# Patient Record
Sex: Female | Born: 1970 | Race: Black or African American | Hispanic: No | Marital: Single | Smoking: Current every day smoker
Health system: Southern US, Community
[De-identification: ages and names within clinical notes are randomized; demographics above are authoritative.]

## PROBLEM LIST (undated history)

## (undated) DIAGNOSIS — E785 Hyperlipidemia, unspecified: Secondary | ICD-10-CM

## (undated) DIAGNOSIS — F329 Major depressive disorder, single episode, unspecified: Secondary | ICD-10-CM

## (undated) DIAGNOSIS — F32A Depression, unspecified: Secondary | ICD-10-CM

## (undated) DIAGNOSIS — M539 Dorsopathy, unspecified: Secondary | ICD-10-CM

## (undated) DIAGNOSIS — I10 Essential (primary) hypertension: Secondary | ICD-10-CM

## (undated) DIAGNOSIS — K219 Gastro-esophageal reflux disease without esophagitis: Secondary | ICD-10-CM

## (undated) HISTORY — DX: Gastro-esophageal reflux disease without esophagitis: K21.9

## (undated) HISTORY — PX: ABDOMINAL HYSTERECTOMY: SHX81

## (undated) HISTORY — PX: OOPHORECTOMY: SHX86

## (undated) HISTORY — PX: JOINT REPLACEMENT: SHX530

## (undated) HISTORY — DX: Major depressive disorder, single episode, unspecified: F32.9

## (undated) HISTORY — PX: ROTATOR CUFF REPAIR: SHX139

## (undated) HISTORY — DX: Essential (primary) hypertension: I10

## (undated) HISTORY — PX: OTHER SURGICAL HISTORY: SHX169

## (undated) HISTORY — PX: CHOLECYSTECTOMY: SHX55

## (undated) HISTORY — DX: Hyperlipidemia, unspecified: E78.5

## (undated) HISTORY — DX: Depression, unspecified: F32.A

## (undated) HISTORY — PX: ELBOW SURGERY: SHX618

## (undated) HISTORY — DX: Dorsopathy, unspecified: M53.9

---

## 2014-11-10 DIAGNOSIS — M85421 Solitary bone cyst, right humerus: Secondary | ICD-10-CM | POA: Diagnosis not present

## 2014-11-10 DIAGNOSIS — M25551 Pain in right hip: Secondary | ICD-10-CM | POA: Diagnosis not present

## 2014-12-10 DIAGNOSIS — Z471 Aftercare following joint replacement surgery: Secondary | ICD-10-CM | POA: Diagnosis not present

## 2014-12-10 DIAGNOSIS — M85651 Other cyst of bone, right thigh: Secondary | ICD-10-CM | POA: Diagnosis not present

## 2014-12-10 DIAGNOSIS — Z96641 Presence of right artificial hip joint: Secondary | ICD-10-CM | POA: Diagnosis not present

## 2014-12-23 DIAGNOSIS — M85651 Other cyst of bone, right thigh: Secondary | ICD-10-CM | POA: Diagnosis not present

## 2014-12-29 DIAGNOSIS — J449 Chronic obstructive pulmonary disease, unspecified: Secondary | ICD-10-CM | POA: Diagnosis not present

## 2014-12-29 DIAGNOSIS — K219 Gastro-esophageal reflux disease without esophagitis: Secondary | ICD-10-CM | POA: Diagnosis not present

## 2014-12-29 DIAGNOSIS — E782 Mixed hyperlipidemia: Secondary | ICD-10-CM | POA: Diagnosis not present

## 2015-01-20 DIAGNOSIS — S83411A Sprain of medial collateral ligament of right knee, initial encounter: Secondary | ICD-10-CM | POA: Diagnosis not present

## 2015-01-27 DIAGNOSIS — E785 Hyperlipidemia, unspecified: Secondary | ICD-10-CM | POA: Diagnosis not present

## 2015-01-27 DIAGNOSIS — Z79899 Other long term (current) drug therapy: Secondary | ICD-10-CM | POA: Diagnosis not present

## 2015-01-27 DIAGNOSIS — E559 Vitamin D deficiency, unspecified: Secondary | ICD-10-CM | POA: Diagnosis not present

## 2015-02-01 DIAGNOSIS — I10 Essential (primary) hypertension: Secondary | ICD-10-CM | POA: Diagnosis not present

## 2015-02-01 DIAGNOSIS — E782 Mixed hyperlipidemia: Secondary | ICD-10-CM | POA: Diagnosis not present

## 2015-02-01 DIAGNOSIS — E559 Vitamin D deficiency, unspecified: Secondary | ICD-10-CM | POA: Diagnosis not present

## 2015-02-05 DIAGNOSIS — S83411D Sprain of medial collateral ligament of right knee, subsequent encounter: Secondary | ICD-10-CM | POA: Diagnosis not present

## 2015-02-15 DIAGNOSIS — G894 Chronic pain syndrome: Secondary | ICD-10-CM | POA: Diagnosis not present

## 2015-02-15 DIAGNOSIS — M545 Low back pain: Secondary | ICD-10-CM | POA: Diagnosis not present

## 2015-02-15 DIAGNOSIS — M25561 Pain in right knee: Secondary | ICD-10-CM | POA: Diagnosis not present

## 2015-02-15 DIAGNOSIS — Z79891 Long term (current) use of opiate analgesic: Secondary | ICD-10-CM | POA: Diagnosis not present

## 2015-02-15 DIAGNOSIS — S83411S Sprain of medial collateral ligament of right knee, sequela: Secondary | ICD-10-CM | POA: Diagnosis not present

## 2015-02-26 DIAGNOSIS — S83411D Sprain of medial collateral ligament of right knee, subsequent encounter: Secondary | ICD-10-CM | POA: Diagnosis not present

## 2015-03-02 DIAGNOSIS — E119 Type 2 diabetes mellitus without complications: Secondary | ICD-10-CM | POA: Diagnosis not present

## 2015-03-02 DIAGNOSIS — R5383 Other fatigue: Secondary | ICD-10-CM | POA: Diagnosis not present

## 2015-03-02 DIAGNOSIS — F17200 Nicotine dependence, unspecified, uncomplicated: Secondary | ICD-10-CM | POA: Diagnosis not present

## 2015-03-02 DIAGNOSIS — R945 Abnormal results of liver function studies: Secondary | ICD-10-CM | POA: Diagnosis not present

## 2015-03-02 DIAGNOSIS — K219 Gastro-esophageal reflux disease without esophagitis: Secondary | ICD-10-CM | POA: Diagnosis not present

## 2015-03-02 DIAGNOSIS — Z6831 Body mass index (BMI) 31.0-31.9, adult: Secondary | ICD-10-CM | POA: Diagnosis not present

## 2015-03-02 DIAGNOSIS — R5381 Other malaise: Secondary | ICD-10-CM | POA: Diagnosis not present

## 2015-03-02 DIAGNOSIS — J449 Chronic obstructive pulmonary disease, unspecified: Secondary | ICD-10-CM | POA: Diagnosis not present

## 2015-03-02 DIAGNOSIS — E785 Hyperlipidemia, unspecified: Secondary | ICD-10-CM | POA: Diagnosis not present

## 2015-03-02 DIAGNOSIS — G4700 Insomnia, unspecified: Secondary | ICD-10-CM | POA: Diagnosis not present

## 2015-03-15 DIAGNOSIS — M25561 Pain in right knee: Secondary | ICD-10-CM | POA: Diagnosis not present

## 2015-03-15 DIAGNOSIS — S83411S Sprain of medial collateral ligament of right knee, sequela: Secondary | ICD-10-CM | POA: Diagnosis not present

## 2015-03-15 DIAGNOSIS — G894 Chronic pain syndrome: Secondary | ICD-10-CM | POA: Diagnosis not present

## 2015-03-15 DIAGNOSIS — M545 Low back pain: Secondary | ICD-10-CM | POA: Diagnosis not present

## 2015-03-19 DIAGNOSIS — M85651 Other cyst of bone, right thigh: Secondary | ICD-10-CM | POA: Diagnosis not present

## 2015-03-19 DIAGNOSIS — S83411A Sprain of medial collateral ligament of right knee, initial encounter: Secondary | ICD-10-CM | POA: Diagnosis not present

## 2015-04-14 DIAGNOSIS — S83411S Sprain of medial collateral ligament of right knee, sequela: Secondary | ICD-10-CM | POA: Diagnosis not present

## 2015-04-14 DIAGNOSIS — M545 Low back pain: Secondary | ICD-10-CM | POA: Diagnosis not present

## 2015-04-14 DIAGNOSIS — G894 Chronic pain syndrome: Secondary | ICD-10-CM | POA: Diagnosis not present

## 2015-04-14 DIAGNOSIS — M25561 Pain in right knee: Secondary | ICD-10-CM | POA: Diagnosis not present

## 2015-04-28 DIAGNOSIS — M545 Low back pain: Secondary | ICD-10-CM | POA: Diagnosis not present

## 2015-05-06 DIAGNOSIS — E559 Vitamin D deficiency, unspecified: Secondary | ICD-10-CM | POA: Diagnosis not present

## 2015-05-21 DIAGNOSIS — S83411S Sprain of medial collateral ligament of right knee, sequela: Secondary | ICD-10-CM | POA: Diagnosis not present

## 2015-05-21 DIAGNOSIS — M545 Low back pain: Secondary | ICD-10-CM | POA: Diagnosis not present

## 2015-05-21 DIAGNOSIS — G894 Chronic pain syndrome: Secondary | ICD-10-CM | POA: Diagnosis not present

## 2015-05-21 DIAGNOSIS — Z79891 Long term (current) use of opiate analgesic: Secondary | ICD-10-CM | POA: Diagnosis not present

## 2015-05-21 DIAGNOSIS — M25561 Pain in right knee: Secondary | ICD-10-CM | POA: Diagnosis not present

## 2015-06-04 DIAGNOSIS — M5127 Other intervertebral disc displacement, lumbosacral region: Secondary | ICD-10-CM | POA: Diagnosis not present

## 2015-06-04 DIAGNOSIS — M25562 Pain in left knee: Secondary | ICD-10-CM | POA: Diagnosis not present

## 2015-06-04 DIAGNOSIS — M545 Low back pain: Secondary | ICD-10-CM | POA: Diagnosis not present

## 2015-06-15 DIAGNOSIS — M79605 Pain in left leg: Secondary | ICD-10-CM | POA: Diagnosis not present

## 2015-06-15 DIAGNOSIS — M5442 Lumbago with sciatica, left side: Secondary | ICD-10-CM | POA: Diagnosis not present

## 2015-06-15 DIAGNOSIS — G8929 Other chronic pain: Secondary | ICD-10-CM | POA: Diagnosis not present

## 2015-06-18 DIAGNOSIS — Z79891 Long term (current) use of opiate analgesic: Secondary | ICD-10-CM | POA: Diagnosis not present

## 2015-06-18 DIAGNOSIS — S83411S Sprain of medial collateral ligament of right knee, sequela: Secondary | ICD-10-CM | POA: Diagnosis not present

## 2015-06-18 DIAGNOSIS — G894 Chronic pain syndrome: Secondary | ICD-10-CM | POA: Diagnosis not present

## 2015-06-18 DIAGNOSIS — M545 Low back pain: Secondary | ICD-10-CM | POA: Diagnosis not present

## 2015-06-18 DIAGNOSIS — M25561 Pain in right knee: Secondary | ICD-10-CM | POA: Diagnosis not present

## 2015-06-19 DIAGNOSIS — R509 Fever, unspecified: Secondary | ICD-10-CM | POA: Diagnosis not present

## 2015-06-19 DIAGNOSIS — J329 Chronic sinusitis, unspecified: Secondary | ICD-10-CM | POA: Diagnosis not present

## 2015-06-19 DIAGNOSIS — J019 Acute sinusitis, unspecified: Secondary | ICD-10-CM | POA: Diagnosis not present

## 2015-07-01 DIAGNOSIS — E039 Hypothyroidism, unspecified: Secondary | ICD-10-CM | POA: Diagnosis not present

## 2015-07-01 DIAGNOSIS — E669 Obesity, unspecified: Secondary | ICD-10-CM | POA: Diagnosis not present

## 2015-07-01 DIAGNOSIS — Z1329 Encounter for screening for other suspected endocrine disorder: Secondary | ICD-10-CM | POA: Diagnosis not present

## 2015-07-01 DIAGNOSIS — Z0000 Encounter for general adult medical examination without abnormal findings: Secondary | ICD-10-CM | POA: Diagnosis not present

## 2015-07-01 DIAGNOSIS — Z79891 Long term (current) use of opiate analgesic: Secondary | ICD-10-CM | POA: Diagnosis not present

## 2015-07-01 DIAGNOSIS — E559 Vitamin D deficiency, unspecified: Secondary | ICD-10-CM | POA: Diagnosis not present

## 2015-07-13 DIAGNOSIS — Z0000 Encounter for general adult medical examination without abnormal findings: Secondary | ICD-10-CM | POA: Diagnosis not present

## 2015-07-13 DIAGNOSIS — N760 Acute vaginitis: Secondary | ICD-10-CM | POA: Diagnosis not present

## 2015-07-21 DIAGNOSIS — S83411S Sprain of medial collateral ligament of right knee, sequela: Secondary | ICD-10-CM | POA: Diagnosis not present

## 2015-07-21 DIAGNOSIS — M545 Low back pain: Secondary | ICD-10-CM | POA: Diagnosis not present

## 2015-07-21 DIAGNOSIS — G894 Chronic pain syndrome: Secondary | ICD-10-CM | POA: Diagnosis not present

## 2015-07-21 DIAGNOSIS — M25561 Pain in right knee: Secondary | ICD-10-CM | POA: Diagnosis not present

## 2015-08-18 DIAGNOSIS — Z23 Encounter for immunization: Secondary | ICD-10-CM | POA: Diagnosis not present

## 2015-08-18 DIAGNOSIS — M25561 Pain in right knee: Secondary | ICD-10-CM | POA: Diagnosis not present

## 2015-08-18 DIAGNOSIS — G894 Chronic pain syndrome: Secondary | ICD-10-CM | POA: Diagnosis not present

## 2015-08-18 DIAGNOSIS — S83411S Sprain of medial collateral ligament of right knee, sequela: Secondary | ICD-10-CM | POA: Diagnosis not present

## 2015-08-18 DIAGNOSIS — M545 Low back pain: Secondary | ICD-10-CM | POA: Diagnosis not present

## 2015-08-23 DIAGNOSIS — J019 Acute sinusitis, unspecified: Secondary | ICD-10-CM | POA: Diagnosis not present

## 2015-08-23 DIAGNOSIS — I10 Essential (primary) hypertension: Secondary | ICD-10-CM | POA: Diagnosis not present

## 2015-08-23 DIAGNOSIS — E782 Mixed hyperlipidemia: Secondary | ICD-10-CM | POA: Diagnosis not present

## 2015-08-23 DIAGNOSIS — E559 Vitamin D deficiency, unspecified: Secondary | ICD-10-CM | POA: Diagnosis not present

## 2015-09-03 DIAGNOSIS — Z1231 Encounter for screening mammogram for malignant neoplasm of breast: Secondary | ICD-10-CM | POA: Diagnosis not present

## 2015-09-03 DIAGNOSIS — R05 Cough: Secondary | ICD-10-CM | POA: Diagnosis not present

## 2015-09-03 DIAGNOSIS — F17200 Nicotine dependence, unspecified, uncomplicated: Secondary | ICD-10-CM | POA: Diagnosis not present

## 2015-10-13 DIAGNOSIS — M545 Low back pain: Secondary | ICD-10-CM | POA: Diagnosis not present

## 2015-10-13 DIAGNOSIS — M25561 Pain in right knee: Secondary | ICD-10-CM | POA: Diagnosis not present

## 2015-10-13 DIAGNOSIS — Z79891 Long term (current) use of opiate analgesic: Secondary | ICD-10-CM | POA: Diagnosis not present

## 2015-10-13 DIAGNOSIS — S83411S Sprain of medial collateral ligament of right knee, sequela: Secondary | ICD-10-CM | POA: Diagnosis not present

## 2015-10-13 DIAGNOSIS — G894 Chronic pain syndrome: Secondary | ICD-10-CM | POA: Diagnosis not present

## 2015-11-15 DIAGNOSIS — E782 Mixed hyperlipidemia: Secondary | ICD-10-CM | POA: Diagnosis not present

## 2015-11-15 DIAGNOSIS — J029 Acute pharyngitis, unspecified: Secondary | ICD-10-CM | POA: Diagnosis not present

## 2015-11-15 DIAGNOSIS — I10 Essential (primary) hypertension: Secondary | ICD-10-CM | POA: Diagnosis not present

## 2015-11-15 DIAGNOSIS — E559 Vitamin D deficiency, unspecified: Secondary | ICD-10-CM | POA: Diagnosis not present

## 2015-11-23 DIAGNOSIS — Z8249 Family history of ischemic heart disease and other diseases of the circulatory system: Secondary | ICD-10-CM | POA: Diagnosis not present

## 2015-11-23 DIAGNOSIS — F17200 Nicotine dependence, unspecified, uncomplicated: Secondary | ICD-10-CM | POA: Diagnosis not present

## 2015-11-23 DIAGNOSIS — R0789 Other chest pain: Secondary | ICD-10-CM | POA: Diagnosis not present

## 2015-11-23 DIAGNOSIS — R031 Nonspecific low blood-pressure reading: Secondary | ICD-10-CM | POA: Diagnosis not present

## 2015-11-23 DIAGNOSIS — Z79891 Long term (current) use of opiate analgesic: Secondary | ICD-10-CM | POA: Diagnosis not present

## 2015-11-23 DIAGNOSIS — E785 Hyperlipidemia, unspecified: Secondary | ICD-10-CM | POA: Diagnosis not present

## 2015-11-23 DIAGNOSIS — Z743 Need for continuous supervision: Secondary | ICD-10-CM | POA: Diagnosis not present

## 2015-11-23 DIAGNOSIS — R0602 Shortness of breath: Secondary | ICD-10-CM | POA: Diagnosis not present

## 2015-11-23 DIAGNOSIS — F419 Anxiety disorder, unspecified: Secondary | ICD-10-CM | POA: Diagnosis not present

## 2015-11-23 DIAGNOSIS — G8929 Other chronic pain: Secondary | ICD-10-CM | POA: Diagnosis not present

## 2015-11-23 DIAGNOSIS — R079 Chest pain, unspecified: Secondary | ICD-10-CM | POA: Diagnosis not present

## 2015-11-23 DIAGNOSIS — Z23 Encounter for immunization: Secondary | ICD-10-CM | POA: Diagnosis not present

## 2015-11-23 DIAGNOSIS — I499 Cardiac arrhythmia, unspecified: Secondary | ICD-10-CM | POA: Diagnosis not present

## 2015-11-24 DIAGNOSIS — E785 Hyperlipidemia, unspecified: Secondary | ICD-10-CM | POA: Diagnosis not present

## 2015-11-24 DIAGNOSIS — E7800 Pure hypercholesterolemia, unspecified: Secondary | ICD-10-CM | POA: Diagnosis not present

## 2015-11-24 DIAGNOSIS — G8929 Other chronic pain: Secondary | ICD-10-CM | POA: Diagnosis not present

## 2015-11-24 DIAGNOSIS — I499 Cardiac arrhythmia, unspecified: Secondary | ICD-10-CM | POA: Diagnosis not present

## 2015-11-24 DIAGNOSIS — R079 Chest pain, unspecified: Secondary | ICD-10-CM | POA: Diagnosis not present

## 2015-11-24 DIAGNOSIS — F17200 Nicotine dependence, unspecified, uncomplicated: Secondary | ICD-10-CM | POA: Diagnosis not present

## 2015-11-24 DIAGNOSIS — Z8249 Family history of ischemic heart disease and other diseases of the circulatory system: Secondary | ICD-10-CM | POA: Diagnosis not present

## 2015-11-25 DIAGNOSIS — E785 Hyperlipidemia, unspecified: Secondary | ICD-10-CM | POA: Diagnosis not present

## 2015-11-25 DIAGNOSIS — R079 Chest pain, unspecified: Secondary | ICD-10-CM | POA: Diagnosis not present

## 2015-11-25 DIAGNOSIS — F17200 Nicotine dependence, unspecified, uncomplicated: Secondary | ICD-10-CM | POA: Diagnosis not present

## 2015-12-08 DIAGNOSIS — S83411S Sprain of medial collateral ligament of right knee, sequela: Secondary | ICD-10-CM | POA: Diagnosis not present

## 2015-12-08 DIAGNOSIS — G894 Chronic pain syndrome: Secondary | ICD-10-CM | POA: Diagnosis not present

## 2015-12-08 DIAGNOSIS — M25561 Pain in right knee: Secondary | ICD-10-CM | POA: Diagnosis not present

## 2015-12-08 DIAGNOSIS — M545 Low back pain: Secondary | ICD-10-CM | POA: Diagnosis not present

## 2015-12-14 DIAGNOSIS — M25551 Pain in right hip: Secondary | ICD-10-CM | POA: Diagnosis not present

## 2015-12-14 DIAGNOSIS — M7651 Patellar tendinitis, right knee: Secondary | ICD-10-CM | POA: Diagnosis not present

## 2015-12-14 DIAGNOSIS — M25561 Pain in right knee: Secondary | ICD-10-CM | POA: Diagnosis not present

## 2015-12-27 DIAGNOSIS — M25562 Pain in left knee: Secondary | ICD-10-CM | POA: Diagnosis not present

## 2015-12-27 DIAGNOSIS — M7122 Synovial cyst of popliteal space [Baker], left knee: Secondary | ICD-10-CM | POA: Diagnosis not present

## 2015-12-27 DIAGNOSIS — M25462 Effusion, left knee: Secondary | ICD-10-CM | POA: Diagnosis not present

## 2015-12-29 DIAGNOSIS — M25561 Pain in right knee: Secondary | ICD-10-CM | POA: Diagnosis not present

## 2015-12-29 DIAGNOSIS — M25551 Pain in right hip: Secondary | ICD-10-CM | POA: Diagnosis not present

## 2015-12-29 DIAGNOSIS — M7651 Patellar tendinitis, right knee: Secondary | ICD-10-CM | POA: Diagnosis not present

## 2016-01-18 DIAGNOSIS — M7651 Patellar tendinitis, right knee: Secondary | ICD-10-CM | POA: Diagnosis not present

## 2016-01-27 DIAGNOSIS — I10 Essential (primary) hypertension: Secondary | ICD-10-CM | POA: Diagnosis not present

## 2016-01-27 DIAGNOSIS — Z8342 Family history of familial hypercholesterolemia: Secondary | ICD-10-CM | POA: Diagnosis not present

## 2016-01-27 DIAGNOSIS — R52 Pain, unspecified: Secondary | ICD-10-CM | POA: Diagnosis not present

## 2016-01-27 DIAGNOSIS — R5383 Other fatigue: Secondary | ICD-10-CM | POA: Diagnosis not present

## 2016-01-27 DIAGNOSIS — D519 Vitamin B12 deficiency anemia, unspecified: Secondary | ICD-10-CM | POA: Diagnosis not present

## 2016-01-27 DIAGNOSIS — E559 Vitamin D deficiency, unspecified: Secondary | ICD-10-CM | POA: Diagnosis not present

## 2016-01-27 DIAGNOSIS — G4700 Insomnia, unspecified: Secondary | ICD-10-CM | POA: Diagnosis not present

## 2016-01-27 DIAGNOSIS — E782 Mixed hyperlipidemia: Secondary | ICD-10-CM | POA: Diagnosis not present

## 2016-01-27 DIAGNOSIS — Z0000 Encounter for general adult medical examination without abnormal findings: Secondary | ICD-10-CM | POA: Diagnosis not present

## 2016-02-02 DIAGNOSIS — G894 Chronic pain syndrome: Secondary | ICD-10-CM | POA: Diagnosis not present

## 2016-02-02 DIAGNOSIS — S83411S Sprain of medial collateral ligament of right knee, sequela: Secondary | ICD-10-CM | POA: Diagnosis not present

## 2016-02-02 DIAGNOSIS — M25561 Pain in right knee: Secondary | ICD-10-CM | POA: Diagnosis not present

## 2016-02-02 DIAGNOSIS — Z79891 Long term (current) use of opiate analgesic: Secondary | ICD-10-CM | POA: Diagnosis not present

## 2016-02-02 DIAGNOSIS — M545 Low back pain: Secondary | ICD-10-CM | POA: Diagnosis not present

## 2016-02-25 DIAGNOSIS — Z885 Allergy status to narcotic agent status: Secondary | ICD-10-CM | POA: Diagnosis not present

## 2016-02-25 DIAGNOSIS — R1012 Left upper quadrant pain: Secondary | ICD-10-CM | POA: Diagnosis not present

## 2016-02-25 DIAGNOSIS — R10812 Left upper quadrant abdominal tenderness: Secondary | ICD-10-CM | POA: Diagnosis not present

## 2016-02-25 DIAGNOSIS — R030 Elevated blood-pressure reading, without diagnosis of hypertension: Secondary | ICD-10-CM | POA: Diagnosis not present

## 2016-02-25 DIAGNOSIS — F17210 Nicotine dependence, cigarettes, uncomplicated: Secondary | ICD-10-CM | POA: Diagnosis not present

## 2016-03-06 DIAGNOSIS — K5909 Other constipation: Secondary | ICD-10-CM | POA: Diagnosis not present

## 2016-03-06 DIAGNOSIS — I10 Essential (primary) hypertension: Secondary | ICD-10-CM | POA: Diagnosis not present

## 2016-03-06 DIAGNOSIS — E782 Mixed hyperlipidemia: Secondary | ICD-10-CM | POA: Diagnosis not present

## 2016-03-06 DIAGNOSIS — E559 Vitamin D deficiency, unspecified: Secondary | ICD-10-CM | POA: Diagnosis not present

## 2016-03-06 DIAGNOSIS — D518 Other vitamin B12 deficiency anemias: Secondary | ICD-10-CM | POA: Diagnosis not present

## 2016-03-07 DIAGNOSIS — G43119 Migraine with aura, intractable, without status migrainosus: Secondary | ICD-10-CM | POA: Diagnosis not present

## 2016-03-07 DIAGNOSIS — Z0001 Encounter for general adult medical examination with abnormal findings: Secondary | ICD-10-CM | POA: Diagnosis not present

## 2016-03-07 DIAGNOSIS — E568 Deficiency of other vitamins: Secondary | ICD-10-CM | POA: Diagnosis not present

## 2016-03-07 DIAGNOSIS — D518 Other vitamin B12 deficiency anemias: Secondary | ICD-10-CM | POA: Diagnosis not present

## 2016-03-07 DIAGNOSIS — D528 Other folate deficiency anemias: Secondary | ICD-10-CM | POA: Diagnosis not present

## 2016-03-07 DIAGNOSIS — E611 Iron deficiency: Secondary | ICD-10-CM | POA: Diagnosis not present

## 2016-03-07 DIAGNOSIS — G4709 Other insomnia: Secondary | ICD-10-CM | POA: Diagnosis not present

## 2016-03-27 DIAGNOSIS — Z885 Allergy status to narcotic agent status: Secondary | ICD-10-CM | POA: Diagnosis not present

## 2016-03-27 DIAGNOSIS — Z79891 Long term (current) use of opiate analgesic: Secondary | ICD-10-CM | POA: Diagnosis not present

## 2016-03-27 DIAGNOSIS — Z8249 Family history of ischemic heart disease and other diseases of the circulatory system: Secondary | ICD-10-CM | POA: Diagnosis not present

## 2016-03-27 DIAGNOSIS — M5432 Sciatica, left side: Secondary | ICD-10-CM | POA: Diagnosis not present

## 2016-03-27 DIAGNOSIS — F419 Anxiety disorder, unspecified: Secondary | ICD-10-CM | POA: Diagnosis not present

## 2016-03-27 DIAGNOSIS — I10 Essential (primary) hypertension: Secondary | ICD-10-CM | POA: Diagnosis not present

## 2016-03-27 DIAGNOSIS — M5442 Lumbago with sciatica, left side: Secondary | ICD-10-CM | POA: Diagnosis not present

## 2016-03-27 DIAGNOSIS — M549 Dorsalgia, unspecified: Secondary | ICD-10-CM | POA: Diagnosis not present

## 2016-03-27 DIAGNOSIS — Z886 Allergy status to analgesic agent status: Secondary | ICD-10-CM | POA: Diagnosis not present

## 2016-03-27 DIAGNOSIS — F17210 Nicotine dependence, cigarettes, uncomplicated: Secondary | ICD-10-CM | POA: Diagnosis not present

## 2016-03-31 ENCOUNTER — Encounter: Payer: Self-pay | Admitting: Family Medicine

## 2016-03-31 ENCOUNTER — Ambulatory Visit (INDEPENDENT_AMBULATORY_CARE_PROVIDER_SITE_OTHER): Payer: Medicare Other | Admitting: Family Medicine

## 2016-03-31 VITALS — BP 120/98 | HR 73 | Temp 98.3°F | Ht 66.0 in | Wt 201.0 lb

## 2016-03-31 DIAGNOSIS — Z130 Encounter for screening for diseases of the blood and blood-forming organs and certain disorders involving the immune mechanism: Secondary | ICD-10-CM

## 2016-03-31 DIAGNOSIS — Z0001 Encounter for general adult medical examination with abnormal findings: Secondary | ICD-10-CM

## 2016-03-31 DIAGNOSIS — Z79899 Other long term (current) drug therapy: Secondary | ICD-10-CM | POA: Diagnosis not present

## 2016-03-31 DIAGNOSIS — E785 Hyperlipidemia, unspecified: Secondary | ICD-10-CM | POA: Diagnosis not present

## 2016-03-31 DIAGNOSIS — G8929 Other chronic pain: Secondary | ICD-10-CM

## 2016-03-31 DIAGNOSIS — R030 Elevated blood-pressure reading, without diagnosis of hypertension: Secondary | ICD-10-CM | POA: Diagnosis not present

## 2016-03-31 DIAGNOSIS — I10 Essential (primary) hypertension: Secondary | ICD-10-CM | POA: Insufficient documentation

## 2016-03-31 DIAGNOSIS — G4700 Insomnia, unspecified: Secondary | ICD-10-CM | POA: Insufficient documentation

## 2016-03-31 DIAGNOSIS — E669 Obesity, unspecified: Secondary | ICD-10-CM | POA: Diagnosis not present

## 2016-03-31 DIAGNOSIS — IMO0001 Reserved for inherently not codable concepts without codable children: Secondary | ICD-10-CM

## 2016-03-31 DIAGNOSIS — M549 Dorsalgia, unspecified: Secondary | ICD-10-CM | POA: Diagnosis not present

## 2016-03-31 LAB — CBC
HCT: 43.9 % (ref 36.0–46.0)
HEMOGLOBIN: 14.4 g/dL (ref 12.0–15.0)
MCHC: 32.7 g/dL (ref 30.0–36.0)
MCV: 85.5 fl (ref 78.0–100.0)
PLATELETS: 384 10*3/uL (ref 150.0–400.0)
RBC: 5.13 Mil/uL — ABNORMAL HIGH (ref 3.87–5.11)
RDW: 13.7 % (ref 11.5–15.5)
WBC: 12.3 10*3/uL — ABNORMAL HIGH (ref 4.0–10.5)

## 2016-03-31 LAB — COMPREHENSIVE METABOLIC PANEL
ALT: 20 U/L (ref 0–35)
AST: 12 U/L (ref 0–37)
Albumin: 4.4 g/dL (ref 3.5–5.2)
Alkaline Phosphatase: 82 U/L (ref 39–117)
BILIRUBIN TOTAL: 0.2 mg/dL (ref 0.2–1.2)
BUN: 16 mg/dL (ref 6–23)
CALCIUM: 9.8 mg/dL (ref 8.4–10.5)
CHLORIDE: 105 meq/L (ref 96–112)
CO2: 29 meq/L (ref 19–32)
Creatinine, Ser: 0.84 mg/dL (ref 0.40–1.20)
GFR: 77.89 mL/min (ref >60.00)
GLUCOSE: 93 mg/dL (ref 70–99)
POTASSIUM: 3.9 meq/L (ref 3.5–5.1)
Sodium: 139 mEq/L (ref 135–145)
Total Protein: 7.6 g/dL (ref 6.0–8.3)

## 2016-03-31 LAB — LIPID PANEL
CHOL/HDL RATIO: 5
Cholesterol: 218 mg/dL — ABNORMAL HIGH (ref 0–200)
HDL: 40.6 mg/dL (ref >39.00)
LDL Cholesterol: 140 mg/dL — ABNORMAL HIGH (ref 0–99)
NONHDL: 177.1
TRIGLYCERIDES: 184 mg/dL — AB (ref 0.0–149.0)
VLDL: 36.8 mg/dL (ref 0.0–40.0)

## 2016-03-31 MED ORDER — BUPROPION HCL ER (XL) 150 MG PO TB24: 150.0000 mg | ORAL_TABLET | Freq: Every day | ORAL | Status: DC

## 2016-03-31 MED ORDER — OXYCODONE-ACETAMINOPHEN 10-325 MG PO TABS: 1.0000 | ORAL_TABLET | Freq: Four times a day (QID) | ORAL | Status: DC | PRN

## 2016-03-31 MED ORDER — QUETIAPINE FUMARATE 100 MG PO TABS: 100.0000 mg | ORAL_TABLET | Freq: Every day | ORAL | Status: DC

## 2016-03-31 MED ORDER — TIZANIDINE HCL 4 MG PO TABS: 4.0000 mg | ORAL_TABLET | Freq: Four times a day (QID) | ORAL | Status: AC | PRN

## 2016-03-31 NOTE — Patient Instructions (Addendum)
It was nice to see you today.  Take the Wellbutrin as prescribed.  We will call with your referral to pain management.  Follow up:  2 weeks for a nurse visit for BP recheck.  Follow up with me in 3-6 months (pending your BP check)  Take care  Dr. Adriana Simas  Health Maintenance, Female Adopting a healthy lifestyle and getting preventive care can go a long way to promote health and wellness. Talk with your health care provider about what schedule of regular examinations is right for you. This is a good chance for you to check in with your provider about disease prevention and staying healthy. In between checkups, there are plenty of things you can do on your own. Experts have done a lot of research about which lifestyle changes and preventive measures are most likely to keep you healthy. Ask your health care provider for more information. WEIGHT AND DIET  Eat a healthy diet  Be sure to include plenty of vegetables, fruits, low-fat dairy products, and lean protein.  Do not eat a lot of foods high in solid fats, added sugars, or salt.  Get regular exercise. This is one of the most important things you can do for your health.  Most adults should exercise for at least 150 minutes each week. The exercise should increase your heart rate and make you sweat (moderate-intensity exercise).  Most adults should also do strengthening exercises at least twice a week. This is in addition to the moderate-intensity exercise.  Maintain a healthy weight  Body mass index (BMI) is a measurement that can be used to identify possible weight problems. It estimates body fat based on height and weight. Your health care provider can help determine your BMI and help you achieve or maintain a healthy weight.  For females 21 years of age and older:   A BMI below 18.5 is considered underweight.  A BMI of 18.5 to 24.9 is normal.  A BMI of 25 to 29.9 is considered overweight.  A BMI of 30 and above is considered  obese.  Watch levels of cholesterol and blood lipids  You should start having your blood tested for lipids and cholesterol at 45 years of age, then have this test every 5 years.  You may need to have your cholesterol levels checked more often if:  Your lipid or cholesterol levels are high.  You are older than 45 years of age.  You are at high risk for heart disease.  CANCER SCREENING   Lung Cancer  Lung cancer screening is recommended for adults 58-15 years old who are at high risk for lung cancer because of a history of smoking.  A yearly low-dose CT scan of the lungs is recommended for people who:  Currently smoke.  Have quit within the past 15 years.  Have at least a 30-pack-year history of smoking. A pack year is smoking an average of one pack of cigarettes a day for 1 year.  Yearly screening should continue until it has been 15 years since you quit.  Yearly screening should stop if you develop a health problem that would prevent you from having lung cancer treatment.  Breast Cancer  Practice breast self-awareness. This means understanding how your breasts normally appear and feel.  It also means doing regular breast self-exams. Let your health care provider know about any changes, no matter how small.  If you are in your 20s or 30s, you should have a clinical breast exam (CBE) by a health  care provider every 1-3 years as part of a regular health exam.  If you are 34 or older, have a CBE every year. Also consider having a breast X-ray (mammogram) every year.  If you have a family history of breast cancer, talk to your health care provider about genetic screening.  If you are at high risk for breast cancer, talk to your health care provider about having an MRI and a mammogram every year.  Breast cancer gene (BRCA) assessment is recommended for women who have family members with BRCA-related cancers. BRCA-related cancers  include:  Breast.  Ovarian.  Tubal.  Peritoneal cancers.  Results of the assessment will determine the need for genetic counseling and BRCA1 and BRCA2 testing. Cervical Cancer Your health care provider may recommend that you be screened regularly for cancer of the pelvic organs (ovaries, uterus, and vagina). This screening involves a pelvic examination, including checking for microscopic changes to the surface of your cervix (Pap test). You may be encouraged to have this screening done every 3 years, beginning at age 21.  For women ages 89-65, health care providers may recommend pelvic exams and Pap testing every 3 years, or they may recommend the Pap and pelvic exam, combined with testing for human papilloma virus (HPV), every 5 years. Some types of HPV increase your risk of cervical cancer. Testing for HPV may also be done on women of any age with unclear Pap test results.  Other health care providers may not recommend any screening for nonpregnant women who are considered low risk for pelvic cancer and who do not have symptoms. Ask your health care provider if a screening pelvic exam is right for you.  If you have had past treatment for cervical cancer or a condition that could lead to cancer, you need Pap tests and screening for cancer for at least 20 years after your treatment. If Pap tests have been discontinued, your risk factors (such as having a new sexual partner) need to be reassessed to determine if screening should resume. Some women have medical problems that increase the chance of getting cervical cancer. In these cases, your health care provider may recommend more frequent screening and Pap tests. Colorectal Cancer  This type of cancer can be detected and often prevented.  Routine colorectal cancer screening usually begins at 45 years of age and continues through 45 years of age.  Your health care provider may recommend screening at an earlier age if you have risk factors for  colon cancer.  Your health care provider may also recommend using home test kits to check for hidden blood in the stool.  A small camera at the end of a tube can be used to examine your colon directly (sigmoidoscopy or colonoscopy). This is done to check for the earliest forms of colorectal cancer.  Routine screening usually begins at age 36.  Direct examination of the colon should be repeated every 5-10 years through 45 years of age. However, you may need to be screened more often if early forms of precancerous polyps or small growths are found. Skin Cancer  Check your skin from head to toe regularly.  Tell your health care provider about any new moles or changes in moles, especially if there is a change in a mole's shape or color.  Also tell your health care provider if you have a mole that is larger than the size of a pencil eraser.  Always use sunscreen. Apply sunscreen liberally and repeatedly throughout the day.  Protect  yourself by wearing long sleeves, pants, a wide-brimmed hat, and sunglasses whenever you are outside. HEART DISEASE, DIABETES, AND HIGH BLOOD PRESSURE   High blood pressure causes heart disease and increases the risk of stroke. High blood pressure is more likely to develop in:  People who have blood pressure in the high end of the normal range (130-139/85-89 mm Hg).  People who are overweight or obese.  People who are African American.  If you are 54-65 years of age, have your blood pressure checked every 3-5 years. If you are 81 years of age or older, have your blood pressure checked every year. You should have your blood pressure measured twice--once when you are at a hospital or clinic, and once when you are not at a hospital or clinic. Record the average of the two measurements. To check your blood pressure when you are not at a hospital or clinic, you can use:  An automated blood pressure machine at a pharmacy.  A home blood pressure monitor.  If you  are between 81 years and 18 years old, ask your health care provider if you should take aspirin to prevent strokes.  Have regular diabetes screenings. This involves taking a blood sample to check your fasting blood sugar level.  If you are at a normal weight and have a low risk for diabetes, have this test once every three years after 45 years of age.  If you are overweight and have a high risk for diabetes, consider being tested at a younger age or more often. PREVENTING INFECTION  Hepatitis B  If you have a higher risk for hepatitis B, you should be screened for this virus. You are considered at high risk for hepatitis B if:  You were born in a country where hepatitis B is common. Ask your health care provider which countries are considered high risk.  Your parents were born in a high-risk country, and you have not been immunized against hepatitis B (hepatitis B vaccine).  You have HIV or AIDS.  You use needles to inject street drugs.  You live with someone who has hepatitis B.  You have had sex with someone who has hepatitis B.  You get hemodialysis treatment.  You take certain medicines for conditions, including cancer, organ transplantation, and autoimmune conditions. Hepatitis C  Blood testing is recommended for:  Everyone born from [birth year] through 1965.  Anyone with known risk factors for hepatitis C. Sexually transmitted infections (STIs)  You should be screened for sexually transmitted infections (STIs) including gonorrhea and chlamydia if:  You are sexually active and are younger than 45 years of age.  You are older than 45 years of age and your health care provider tells you that you are at risk for this type of infection.  Your sexual activity has changed since you were last screened and you are at an increased risk for chlamydia or gonorrhea. Ask your health care provider if you are at risk.  If you do not have HIV, but are at risk, it may be recommended that you  take a prescription medicine daily to prevent HIV infection. This is called pre-exposure prophylaxis (PrEP). You are considered at risk if:  You are sexually active and do not regularly use condoms or know the HIV status of your partner(s).  You take drugs by injection.  You are sexually active with a partner who has HIV. Talk with your health care provider about whether you are at high risk of being infected with  HIV. If you choose to begin PrEP, you should first be tested for HIV. You should then be tested every 3 months for as long as you are taking PrEP.  PREGNANCY   If you are premenopausal and you may become pregnant, ask your health care provider about preconception counseling.  If you may become pregnant, take 400 to 800 micrograms (mcg) of folic acid every day.  If you want to prevent pregnancy, talk to your health care provider about birth control (contraception). OSTEOPOROSIS AND MENOPAUSE   Osteoporosis is a disease in which the bones lose minerals and strength with aging. This can result in serious bone fractures. Your risk for osteoporosis can be identified using a bone density scan.  If you are 53 years of age or older, or if you are at risk for osteoporosis and fractures, ask your health care provider if you should be screened.  Ask your health care provider whether you should take a calcium or vitamin D supplement to lower your risk for osteoporosis.  Menopause may have certain physical symptoms and risks.  Hormone replacement therapy may reduce some of these symptoms and risks. Talk to your health care provider about whether hormone replacement therapy is right for you.  HOME CARE INSTRUCTIONS   Schedule regular health, dental, and eye exams.  Stay current with your immunizations.   Do not use any tobacco products including cigarettes, chewing tobacco, or electronic cigarettes.  If you are pregnant, do not drink alcohol.  If you are breastfeeding, limit how  much and how often you drink alcohol.  Limit alcohol intake to no more than 1 drink per day for nonpregnant women. One drink equals 12 ounces of beer, 5 ounces of wine, or 1 ounces of hard liquor.  Do not use street drugs.  Do not share needles.  Ask your health care provider for help if you need support or information about quitting drugs.  Tell your health care provider if you often feel depressed.  Tell your health care provider if you have ever been abused or do not feel safe at home.   This information is not intended to replace advice given to you by your health care provider. Make sure you discuss any questions you have with your health care provider.   Document Released: 04/03/2011 Document Revised: 10/09/2014 Document Reviewed: 08/20/2013 Elsevier Interactive Patient Education [employer].

## 2016-03-31 NOTE — Progress Notes (Signed)
Subjective:  Patient ID: Meredith Mckee, female    DOB: 07/26/1971  Age: 45 y.o. MRN: [id number]  CC: Establish care, Chronic pain, Discuss weight loss medication  HPI Meredith Mckee is a 45 y.o. female presents to the clinic today to establish care.  Preventative Healthcare  Pap smear: No longer needed given s/p hysterectomy for benign reasons.  Mammogram: Up to date. 2017.  Colonoscopy: Not yet indicated.   Immunizations  Tetanus - Unsure.  Pneumococcal - Candidate for given that she is a smoker.  Labs: Labs today. See orders.  Exercise: Reports regular exercise.  Alcohol use: See below.  Smoking/tobacco use: Current smoker.  Chronic pain  Patient with chronic low back pain.  Has been followed by Pain management in [place].  Needs referral today.  Needs refill on Percocet until she is able to establish.  Obesity  Patient has been on Adderall for weight loss be prior PCP.  This is not appropriate.   She would like to discuss weight loss medications today.  PMH, Surgical Hx, Family Hx, Social History reviewed and updated as below.  Past Medical History  Diagnosis Date  . Hyperlipidemia   . Hypertension   . Multilevel degenerative disc disease   . GERD (gastroesophageal reflux disease)   . Depression    Past Surgical History  Procedure Laterality Date  . Cholecystectomy    . Abdominal hysterectomy    . Joint replacement      Great toes  . Rotator cuff repair Left   . Elbow surgery Right     Ulnar nerve release  . Other surgical history      Reports surgery on R Femur for cyst; states rod was placed.   Family History  Problem Relation Age of Onset  . Lung cancer Mother   . Alzheimer's disease Mother   . Diabetes Father   . Lung cancer Father   . Heart disease Father   . Diabetes Maternal Grandmother    Social History  Substance Use Topics  . Smoking status: Current Every Day Smoker -- 1.00 packs/day for 32 years    Types:  Cigarettes  . Smokeless tobacco: Never Used  . Alcohol Use: No   Review of Systems  Cardiovascular: Positive for palpitations.  Gastrointestinal: Positive for abdominal pain.  Genitourinary:       Sexual difficulty.  Musculoskeletal: Positive for back pain.  Psychiatric/Behavioral:       Sadness, anxiety.  All other systems reviewed and are negative.  Objective:   Today's Vitals: BP 120/98 mmHg  Pulse 73  Temp(Src) 98.3 F (36.8 C)  Ht _0  (1.676 m)  Wt 201 lb (91.173 kg)  BMI 32.46 kg/m2  SpO2 95%  Physical Exam  Constitutional: She is oriented to person, place, and time. She appears well-developed and well-nourished. No distress.  HENT:  Head: Normocephalic and atraumatic.  Nose: Nose normal.  Mouth/Throat: Oropharynx is clear and moist. No oropharyngeal exudate.  Normal TM's bilaterally.   Eyes: Conjunctivae are normal. No scleral icterus.  Neck: Neck supple. No thyromegaly present.  Cardiovascular: Normal rate and regular rhythm.   No murmur heard. Pulmonary/Chest: Effort normal and breath sounds normal. She has no wheezes. She has no rales.  Abdominal: Soft. She exhibits no distension. There is no tenderness. There is no rebound and no guarding.  Musculoskeletal: Normal range of motion. She exhibits no edema.  Lymphadenopathy:    She has no cervical adenopathy.  Neurological: She is alert and oriented to person, place,  and time.  Skin: Skin is warm and dry. No rash noted.  Psychiatric: She has a normal mood and affect.  Vitals reviewed.  Assessment & Plan:   Problem List Items Addressed This Visit    Chronic back pain    Placing referral to pain management. Rx given (short term) until she can see pain management. Percocet 10/325  # 120.      Relevant Medications   tiZANidine (ZANAFLEX) 4 MG tablet   oxyCODONE-acetaminophen (PERCOCET) 10-325 MG tablet   Elevated blood pressure    Diastolic elevated today. Follow up BP check in 2 weeks.      Relevant  Orders   Comp Met (CMET) (Completed)   Hyperlipidemia   Relevant Orders   Lipid Profile (Completed)   Obesity (BMI 30.0-34.9)    Starting Wellbutrin as hopefully it will aid in smoking cessation as well.      Encounter for preventative adult health care exam with abnormal findings - Primary    Pap smear no longer needed. Mammogram up to date.  Labs today.        Other Visit Diagnoses    Screening for deficiency anemia        Long-term use of high-risk medication        Relevant Orders    CBC (Completed)      Outpatient Encounter Prescriptions as of 03/31/2016  Medication Sig  . buPROPion (WELLBUTRIN XL) 150 MG 24 hr tablet Take 1 tablet (150 mg total) by mouth daily.  . Folic Acid-Vit N8-MVE H20 (FOLBEE) 2.5-25-1 MG TABS tablet Take 1 tablet by mouth daily.  Marland Kitchen oxyCODONE-acetaminophen (PERCOCET) 10-325 MG tablet Take 1 tablet by mouth every 6 (six) hours as needed for pain.  Marland Kitchen QUEtiapine (SEROQUEL) 100 MG tablet Take 1 tablet (100 mg total) by mouth at bedtime.  Marland Kitchen Specialty Vitamins Products (MAGNESIUM, AMINO ACID CHELATE,) 133 MG tablet Take 1 tablet by mouth 2 (two) times daily.  Marland Kitchen tiZANidine (ZANAFLEX) 4 MG tablet Take 1 tablet (4 mg total) by mouth every 6 (six) hours as needed for muscle spasms.  . [DISCONTINUED] amphetamine-dextroamphetamine (ADDERALL) 10 MG tablet Take 10 mg by mouth daily with breakfast.  . [DISCONTINUED] oxyCODONE-acetaminophen (PERCOCET) 10-325 MG tablet Take 1 tablet by mouth every 6 (six) hours as needed for pain.  . [DISCONTINUED] oxyCODONE-acetaminophen (PERCOCET) 10-325 MG tablet Take 1 tablet by mouth every 6 (six) hours as needed for pain.  . [DISCONTINUED] QUEtiapine (SEROQUEL) 100 MG tablet Take 100 mg by mouth at bedtime.  . [DISCONTINUED] tiZANidine (ZANAFLEX) 4 MG tablet Take 4 mg by mouth every 6 (six) hours as needed for muscle spasms.   No facility-administered encounter medications on file as of 03/31/2016.   Follow-up: 2 weeks for BP  check.  [place]

## 2016-04-03 NOTE — Assessment & Plan Note (Addendum)
Placing referral to pain management. Rx given (short term) until she can see pain management. Percocet 10/325  # 120.

## 2016-04-03 NOTE — Assessment & Plan Note (Signed)
Diastolic elevated today. Follow up BP check in 2 weeks.

## 2016-04-03 NOTE — Assessment & Plan Note (Signed)
Starting Wellbutrin as hopefully it will aid in smoking cessation as well.

## 2016-04-03 NOTE — Assessment & Plan Note (Signed)
Pap smear no longer needed. Mammogram up to date.  Labs today.

## 2016-04-06 ENCOUNTER — Other Ambulatory Visit: Payer: Self-pay | Admitting: Family Medicine

## 2016-04-06 DIAGNOSIS — E785 Hyperlipidemia, unspecified: Secondary | ICD-10-CM

## 2016-04-06 DIAGNOSIS — E669 Obesity, unspecified: Secondary | ICD-10-CM

## 2016-04-14 ENCOUNTER — Ambulatory Visit: Payer: Medicare Other | Admitting: Family Medicine

## 2016-04-14 DIAGNOSIS — Z0289 Encounter for other administrative examinations: Secondary | ICD-10-CM

## 2016-04-18 ENCOUNTER — Ambulatory Visit (INDEPENDENT_AMBULATORY_CARE_PROVIDER_SITE_OTHER): Payer: Medicare Other | Admitting: Family Medicine

## 2016-04-18 ENCOUNTER — Encounter: Payer: Self-pay | Admitting: Family Medicine

## 2016-04-18 VITALS — BP 135/86 | HR 77 | Temp 98.2°F | Wt 202.5 lb

## 2016-04-18 DIAGNOSIS — I10 Essential (primary) hypertension: Secondary | ICD-10-CM | POA: Diagnosis not present

## 2016-04-18 DIAGNOSIS — E669 Obesity, unspecified: Secondary | ICD-10-CM | POA: Diagnosis not present

## 2016-04-18 DIAGNOSIS — G8929 Other chronic pain: Secondary | ICD-10-CM

## 2016-04-18 DIAGNOSIS — M549 Dorsalgia, unspecified: Secondary | ICD-10-CM

## 2016-04-18 NOTE — Progress Notes (Signed)
Pre visit review using our clinic review tool, if applicable. No additional management support is needed unless otherwise documented below in the visit note. 

## 2016-04-18 NOTE — Patient Instructions (Signed)
We will call regarding your pain management referral as well as the nutritionist.  Continue your current medications.  Follow up in 6 months or sooner if needed  Take care  Dr. Adriana Simasook

## 2016-04-19 NOTE — Assessment & Plan Note (Signed)
Well controlled currently.  Continue Toprol XL.

## 2016-04-19 NOTE — Assessment & Plan Note (Signed)
Stable. Needs to see pain management. [facility]. Placing referral.

## 2016-04-19 NOTE — Assessment & Plan Note (Signed)
Stable. Wants to see nutrition. Placing referral.

## 2016-04-19 NOTE — Progress Notes (Signed)
   Subjective:  Patient ID: [name], female    DOB: April 12, 1971  Age: 45 y.o. MRN: [id number]  CC: Follow up  HPI:  45 year old female with chronic back pain, HTN, HLD presents for follow up.  HTN  Well controlled currently.  At our last visit patient was not on any medications.  She states that she restarted her prior Toprol-XL and has been doing well.  Chronic pain  [facility] clinic declined.  In need of new referral.  Currently stable on Percocet.  Obesity  Concerned about her weight and wants to see nutrition.  Social Hx   Social History   Social History  . Marital Status: Single    Spouse Name: N/A  . Number of Children: N/A  . Years of Education: N/A   Social History Main Topics  . Smoking status: Current Every Day Smoker -- 1.00 packs/day for 32 years    Types: Cigarettes  . Smokeless tobacco: Never Used  . Alcohol Use: No  . Drug Use: No  . Sexual Activity: Not Asked   Other Topics Concern  . None   Social History Narrative   Review of Systems  Respiratory: Negative.   Cardiovascular: Negative.   Musculoskeletal: Positive for back pain.   Objective:  BP 135/86 mmHg  Pulse 77  Temp(Src) 98.2 F (36.8 C) (Oral)  Wt 202 lb 8 oz (91.853 kg)  SpO2 97%  BP/Weight 04/18/2016 03/31/2016  Systolic BP 135 120  Diastolic BP 86 98  Wt. (Lbs) 202.5 201  BMI 32.7 32.46   Physical Exam  Constitutional: She is oriented to person, place, and time. She appears well-developed. No distress.  Cardiovascular: Normal rate and regular rhythm.   Pulmonary/Chest: Effort normal. She has no wheezes. She has no rales.  Neurological: She is alert and oriented to person, place, and time.  Psychiatric: She has a normal mood and affect.  Vitals reviewed.  Lab Results  Component Value Date   WBC 12.3* 03/31/2016   HGB 14.4 03/31/2016   HCT 43.9 03/31/2016   PLT 384.0 03/31/2016   GLUCOSE 93 03/31/2016   CHOL 218* 03/31/2016   TRIG 184.0* 03/31/2016   HDL  40.60 03/31/2016   LDLCALC 140* 03/31/2016   ALT 20 03/31/2016   AST 12 03/31/2016   NA 139 03/31/2016   K 3.9 03/31/2016   CL 105 03/31/2016   CREATININE 0.84 03/31/2016   BUN 16 03/31/2016   CO2 29 03/31/2016   Assessment & Plan:   Problem List Items Addressed This Visit    Chronic back pain    Stable. Needs to see pain management. [facility]. Placing referral.      Relevant Orders   Ambulatory referral to Pain Clinic   Benign essential HTN - Primary    Well controlled currently.  Continue Toprol XL.      Relevant Medications   metoprolol succinate (TOPROL-XL) 25 MG 24 hr tablet   Obesity (BMI 30.0-34.9)    Stable. Wants to see nutrition. Placing referral.      Relevant Orders   Amb ref to Medical Nutrition Therapy-MNT     Meds ordered this encounter  Medications  . metoprolol succinate (TOPROL-XL) 25 MG 24 hr tablet    Sig: Take 25 mg by mouth daily.    Follow-up: 6 months.  Everlene OtherJayce Rozell Kettlewell DO [facility] [place]

## 2016-04-28 ENCOUNTER — Emergency Department
Admission: EM | Admit: 2016-04-28 | Discharge: 2016-04-28 | Disposition: A | Discharge status: Home / Self Care | Payer: Medicare Other | Attending: Emergency Medicine | Admitting: Emergency Medicine

## 2016-04-28 ENCOUNTER — Emergency Department: Payer: Medicare Other

## 2016-04-28 ENCOUNTER — Encounter: Payer: Self-pay | Admitting: Emergency Medicine

## 2016-04-28 DIAGNOSIS — Z5321 Procedure and treatment not carried out due to patient leaving prior to being seen by health care provider: Secondary | ICD-10-CM | POA: Diagnosis not present

## 2016-04-28 DIAGNOSIS — R079 Chest pain, unspecified: Secondary | ICD-10-CM | POA: Diagnosis present

## 2016-04-28 DIAGNOSIS — R0602 Shortness of breath: Secondary | ICD-10-CM | POA: Diagnosis not present

## 2016-04-28 DIAGNOSIS — F17210 Nicotine dependence, cigarettes, uncomplicated: Secondary | ICD-10-CM | POA: Insufficient documentation

## 2016-04-28 LAB — BASIC METABOLIC PANEL
ANION GAP: 10 (ref 5–15)
BUN: 10 mg/dL (ref 6–20)
CO2: 24 mmol/L (ref 22–32)
Calcium: 9.5 mg/dL (ref 8.9–10.3)
Chloride: 105 mmol/L (ref 101–111)
Creatinine, Ser: 0.81 mg/dL (ref 0.44–1.00)
GFR calc Af Amer: >60 mL/min (ref >60)
Glucose, Bld: 102 mg/dL — ABNORMAL HIGH (ref 65–99)
POTASSIUM: 3.7 mmol/L (ref 3.5–5.1)
SODIUM: 139 mmol/L (ref 135–145)

## 2016-04-28 LAB — TROPONIN I: Troponin I: <0.03 ng/mL (ref <0.03)

## 2016-04-28 LAB — CBC
HEMATOCRIT: 43 % (ref 35.0–47.0)
HEMOGLOBIN: 14.4 g/dL (ref 12.0–16.0)
MCH: 28.4 pg (ref 26.0–34.0)
MCHC: 33.6 g/dL (ref 32.0–36.0)
MCV: 84.7 fL (ref 80.0–100.0)
Platelets: 321 10*3/uL (ref 150–440)
RBC: 5.07 MIL/uL (ref 3.80–5.20)
RDW: 13.3 % (ref 11.5–14.5)
WBC: 7.4 10*3/uL (ref 3.6–11.0)

## 2016-04-28 NOTE — ED Triage Notes (Signed)
Pt reports acute onset mid chest pain with SHOB that started 2 hrs ago. Pain is sharp. Has had diaphoresis and shaking with it per pt. No family hx cardiac disease.

## 2016-05-01 ENCOUNTER — Other Ambulatory Visit: Payer: Self-pay | Admitting: *Deleted

## 2016-05-01 NOTE — Telephone Encounter (Signed)
Patient requested a medicartion refill for oxycodone

## 2016-05-01 NOTE — Telephone Encounter (Signed)
Please advise for refill, was refilled on 6/30 #120. thanks

## 2016-05-01 NOTE — Telephone Encounter (Signed)
Needs to see pain management

## 2016-05-01 NOTE — Telephone Encounter (Signed)
Please advise for referral, thanks 

## 2016-05-03 ENCOUNTER — Telehealth: Payer: Self-pay | Admitting: Emergency Medicine

## 2016-05-03 NOTE — Telephone Encounter (Signed)
Called patient due to lwot to inquire about condition and follow up plans. Advised patient to follow up with pcp and have them review labs and xray. She says she may follow up.

## 2016-05-09 ENCOUNTER — Telehealth: Payer: Self-pay | Admitting: Family Medicine

## 2016-05-09 NOTE — Telephone Encounter (Signed)
Betty from [facility] called and states that this pt needs to have a MRI of her lumbar or thoracic spine (whichever location is hurting her)before they can schedule her to be seen. She has been accepted as a patient but needs this prior to scheduling.

## 2016-05-09 NOTE — Telephone Encounter (Signed)
See if we can get records from pain management in [place].  Thanks  JC DO

## 2016-05-12 ENCOUNTER — Encounter (INDEPENDENT_AMBULATORY_CARE_PROVIDER_SITE_OTHER): Payer: Self-pay

## 2016-05-12 ENCOUNTER — Ambulatory Visit (INDEPENDENT_AMBULATORY_CARE_PROVIDER_SITE_OTHER): Payer: Medicare Other | Admitting: Family Medicine

## 2016-05-12 ENCOUNTER — Ambulatory Visit
Admission: RE | Admit: 2016-05-12 | Discharge: 2016-05-12 | Disposition: A | Discharge status: Home / Self Care | Payer: Medicare Other | Source: Ambulatory Visit | Attending: Family Medicine | Admitting: Family Medicine

## 2016-05-12 ENCOUNTER — Encounter: Payer: Self-pay | Admitting: Family Medicine

## 2016-05-12 VITALS — BP 152/83 | HR 102 | Temp 98.3°F | Wt 200.5 lb

## 2016-05-12 DIAGNOSIS — Z23 Encounter for immunization: Secondary | ICD-10-CM | POA: Diagnosis not present

## 2016-05-12 DIAGNOSIS — M545 Low back pain, unspecified: Secondary | ICD-10-CM

## 2016-05-12 DIAGNOSIS — R1033 Periumbilical pain: Secondary | ICD-10-CM | POA: Insufficient documentation

## 2016-05-12 DIAGNOSIS — G8929 Other chronic pain: Secondary | ICD-10-CM | POA: Insufficient documentation

## 2016-05-12 DIAGNOSIS — M5137 Other intervertebral disc degeneration, lumbosacral region: Secondary | ICD-10-CM | POA: Diagnosis not present

## 2016-05-12 DIAGNOSIS — M5136 Other intervertebral disc degeneration, lumbar region: Secondary | ICD-10-CM | POA: Diagnosis not present

## 2016-05-12 LAB — COMPREHENSIVE METABOLIC PANEL
ALBUMIN: 4.3 g/dL (ref 3.5–5.2)
ALT: 19 U/L (ref 0–35)
AST: 19 U/L (ref 0–37)
Alkaline Phosphatase: 71 U/L (ref 39–117)
BILIRUBIN TOTAL: 0.4 mg/dL (ref 0.2–1.2)
BUN: 9 mg/dL (ref 6–23)
CALCIUM: 9.7 mg/dL (ref 8.4–10.5)
CHLORIDE: 105 meq/L (ref 96–112)
CO2: 28 mEq/L (ref 19–32)
CREATININE: 0.83 mg/dL (ref 0.40–1.20)
GFR: 95.51 mL/min (ref >60.00)
Glucose, Bld: 101 mg/dL — ABNORMAL HIGH (ref 70–99)
Potassium: 4.1 mEq/L (ref 3.5–5.1)
Sodium: 139 mEq/L (ref 135–145)
Total Protein: 7 g/dL (ref 6.0–8.3)

## 2016-05-12 LAB — CBC
HCT: 43.8 % (ref 36.0–46.0)
Hemoglobin: 14.5 g/dL (ref 12.0–15.0)
MCHC: 33.1 g/dL (ref 30.0–36.0)
MCV: 84.9 fl (ref 78.0–100.0)
Platelets: 323 10*3/uL (ref 150.0–400.0)
RBC: 5.15 Mil/uL — AB (ref 3.87–5.11)
RDW: 13.8 % (ref 11.5–15.5)
WBC: 6.9 10*3/uL (ref 4.0–10.5)

## 2016-05-12 LAB — LIPASE: LIPASE: 38 U/L (ref 11.0–59.0)

## 2016-05-12 MED ORDER — OXYCODONE-ACETAMINOPHEN 10-325 MG PO TABS: 1.0000 | ORAL_TABLET | Freq: Four times a day (QID) | ORAL | 0 refills | Status: DC | PRN

## 2016-05-12 MED ORDER — DIPHENOXYLATE-ATROPINE 2.5-0.025 MG PO TABS: 2.0000 | ORAL_TABLET | Freq: Four times a day (QID) | ORAL | 0 refills | Status: DC | PRN

## 2016-05-12 NOTE — Assessment & Plan Note (Signed)
Established problem, worsening. Pain management appt pending/in process. Arranging MRI per their request. Oxycodone refilled (temporarily).

## 2016-05-12 NOTE — Progress Notes (Signed)
Subjective:  Patient ID: Meredith Mckee, female    DOB: September 07, 1971  Age: 45 y.o. MRN: [id number]  CC: Abdominal pain/Nausea/Diarrhea, Low back pain  HPI:  45 year old female presents with the above complaints.  Patient reports she has had periumbilical abdominal pain and associated nausea and diarrhea for the past week. Patient states that she feels like she's been "kicked in the stomach". Pain is worse after eating and worse with the diarrhea. She's been taking Pepto-Bismol and Imodium without resolution. She states that her stool has some form but is predominantly liquid. No changes in her diet. No changes in her medications. No associated fevers or chills. No other associated symptoms.   Patient has yet to see pain management. There has been some difficulties with scheduling. She is in need of her pain medication as she has been out since Monday. Pain is worsening as a result of being out of her medication. Her appointment with pain management has yet to be scheduled as they're requesting an MRI.  Social Hx   Social History   Social History  . Marital status: Single    Spouse name: N/A  . Number of children: N/A  . Years of education: N/A   Social History Main Topics  . Smoking status: Current Every Day Smoker    Packs/day: 1.00    Years: 32.00    Types: Cigarettes  . Smokeless tobacco: Never Used  . Alcohol use No  . Drug use: No  . Sexual activity: Not Asked   Other Topics Concern  . None   Social History Narrative  . None    Review of Systems  Gastrointestinal: Positive for abdominal pain, diarrhea and nausea. Negative for vomiting.  Musculoskeletal: Positive for back pain.   Objective:  BP (!) 152/83 (BP Location: Right Arm, Patient Position: Sitting, Cuff Size: Large)   Pulse (!) 102   Temp 98.3 F (36.8 C) (Oral)   Wt 200 lb 8 oz (90.9 kg)   SpO2 98%   BMI 32.36 kg/m   BP/Weight 05/12/2016 04/28/2016 4/40/3474  Systolic BP 259 563 875  Diastolic BP 83  98 86  Wt. (Lbs) 200.5 202 202.5  BMI 32.36 32.6 32.7   Physical Exam  Constitutional: She is oriented to person, place, and time. She appears well-developed. No distress.  Cardiovascular: Normal rate.   Regularly irregular (ectopy; known hx of PAC's).  Pulmonary/Chest: Effort normal. She has no wheezes. She has no rales.  Abdominal: Soft. She exhibits no distension.  Tender to palpation just above the umbilicus. No rebound or guarding.   Neurological: She is alert and oriented to person, place, and time.  Psychiatric: She has a normal mood and affect.  Vitals reviewed.  Lab Results  Component Value Date   WBC 7.4 04/28/2016   HGB 14.4 04/28/2016   HCT 43.0 04/28/2016   PLT 321 04/28/2016   GLUCOSE 102 (H) 04/28/2016   CHOL 218 (H) 03/31/2016   TRIG 184.0 (H) 03/31/2016   HDL 40.60 03/31/2016   LDLCALC 140 (H) 03/31/2016   ALT 20 03/31/2016   AST 12 03/31/2016   NA 139 04/28/2016   K 3.7 04/28/2016   CL 105 04/28/2016   CREATININE 0.81 04/28/2016   BUN 10 04/28/2016   CO2 24 04/28/2016    Assessment & Plan:   Problem List Items Addressed This Visit    Chronic low back pain    Established problem, worsening. Pain management appt pending/in process. Arranging MRI per their request. Oxycodone refilled (temporarily).  Relevant Medications   oxyCODONE-acetaminophen (PERCOCET) 10-325 MG tablet   Other Relevant Orders   MR Lumbar Spine Wo Contrast   Periumbilical abdominal pain - Primary    New problem. Unclear etiology/prognosis at this time. I do favor a benign calls given her exam findings and symptoms. Laboratory studies today. See orders. Treating with Lomotil.      Relevant Orders   Comp Met (CMET)   Lipase   CBC    Other Visit Diagnoses   None.     Meds ordered this encounter  Medications  . diphenoxylate-atropine (LOMOTIL) 2.5-0.025 MG tablet    Sig: Take 2 tablets by mouth 4 (four) times daily as needed for diarrhea or loose stools.     Dispense:  60 tablet    Refill:  0  . oxyCODONE-acetaminophen (PERCOCET) 10-325 MG tablet    Sig: Take 1 tablet by mouth every 6 (six) hours as needed for pain.    Dispense:  120 tablet    Refill:  0   Follow-up: PRN  [place]

## 2016-05-12 NOTE — Assessment & Plan Note (Signed)
New problem. Unclear etiology/prognosis at this time. I do favor a benign calls given her exam findings and symptoms. Laboratory studies today. See orders. Treating with Lomotil.

## 2016-05-12 NOTE — Patient Instructions (Signed)
We will call with your lab results.  Take the medication as prescribed.  If it worsens let me know  Take care  Dr. Adriana Simasook

## 2016-05-12 NOTE — Progress Notes (Signed)
Pre visit review using our clinic review tool, if applicable. No additional management support is needed unless otherwise documented below in the visit note. 

## 2016-05-15 DIAGNOSIS — H903 Sensorineural hearing loss, bilateral: Secondary | ICD-10-CM | POA: Diagnosis not present

## 2016-05-23 ENCOUNTER — Ambulatory Visit (INDEPENDENT_AMBULATORY_CARE_PROVIDER_SITE_OTHER): Payer: Medicare Other

## 2016-05-23 ENCOUNTER — Telehealth: Payer: Self-pay | Admitting: Family Medicine

## 2016-05-23 DIAGNOSIS — Z23 Encounter for immunization: Secondary | ICD-10-CM

## 2016-05-23 NOTE — Telephone Encounter (Signed)
Pt called needing a copy of her MRI [facility] [place]. Please advise?

## 2016-05-23 NOTE — Telephone Encounter (Signed)
Ok. Thank you.

## 2016-05-23 NOTE — Telephone Encounter (Signed)
Pt wants to get her Tetnus/tdap done. Pt has medicare does she still need to check with her insurance?

## 2016-05-23 NOTE — Progress Notes (Signed)
Patient came in for tdap, received in right deltoid.  Patient tolerated well.

## 2016-05-23 NOTE — Telephone Encounter (Signed)
MRI has been [facility]ed to [facility]@ [phone].

## 2016-05-23 NOTE — Telephone Encounter (Signed)
Yes it should cover it. Can schedule

## 2016-05-25 ENCOUNTER — Telehealth: Payer: Self-pay | Admitting: Family Medicine

## 2016-05-25 MED ORDER — QUETIAPINE FUMARATE 100 MG PO TABS: 100.0000 mg | ORAL_TABLET | Freq: Every day | ORAL | 3 refills | Status: DC

## 2016-05-25 NOTE — Telephone Encounter (Signed)
Pt called needing a refill for QUEtiapine (SEROQUEL) 100 MG tablet.  [facility] [facility] [place], [place] - [id number]  Call pt @ [phone]. Thank you!

## 2016-05-25 NOTE — Telephone Encounter (Signed)
Medication refill

## 2016-05-26 ENCOUNTER — Telehealth: Payer: Self-pay | Admitting: Family Medicine

## 2016-05-26 NOTE — Telephone Encounter (Signed)
Patient scheduled.

## 2016-05-26 NOTE — Telephone Encounter (Signed)
Please advise 

## 2016-05-26 NOTE — Telephone Encounter (Signed)
Ms. Meredith Mckee called saying she needs the Rx for Seroquel changed to her taking two pills per night. That's what she's been taking in order to sleep. She's completely out of medication and is at the pharmacy now. She'd like a phone call so she'll know if she needs to stay at the pharmacy to wait on the Rx to arrive or if she should leave. Please give her a phone call.   Pt's ph# [phone] Thank you.

## 2016-05-26 NOTE — Telephone Encounter (Signed)
Dr Adriana Simasook ok'd refills yesterday.  She is on 100mg  q hs.  Would continue with the prescription the way it is written for now.  If having issues with sleep needs a f/u appt to discuss.  Can forward message to Dr Adriana Simasook so that he is aware.

## 2016-05-26 NOTE — Telephone Encounter (Signed)
Patient would like to book appointment

## 2016-05-29 ENCOUNTER — Encounter: Payer: Self-pay | Admitting: Family Medicine

## 2016-05-29 ENCOUNTER — Other Ambulatory Visit: Payer: Self-pay | Admitting: Family Medicine

## 2016-05-29 ENCOUNTER — Ambulatory Visit: Payer: Medicare Other | Admitting: Family Medicine

## 2016-05-29 MED ORDER — QUETIAPINE FUMARATE 100 MG PO TABS: 200.0000 mg | ORAL_TABLET | Freq: Every day | ORAL | 3 refills | Status: DC

## 2016-05-29 NOTE — Telephone Encounter (Signed)
PCP gave verbal okay to refill.  

## 2016-05-30 ENCOUNTER — Telehealth: Payer: Self-pay | Admitting: Family Medicine

## 2016-05-30 DIAGNOSIS — G894 Chronic pain syndrome: Secondary | ICD-10-CM | POA: Diagnosis not present

## 2016-05-30 DIAGNOSIS — Z79899 Other long term (current) drug therapy: Secondary | ICD-10-CM | POA: Diagnosis not present

## 2016-05-30 DIAGNOSIS — Z79891 Long term (current) use of opiate analgesic: Secondary | ICD-10-CM | POA: Diagnosis not present

## 2016-05-30 DIAGNOSIS — M47817 Spondylosis without myelopathy or radiculopathy, lumbosacral region: Secondary | ICD-10-CM | POA: Diagnosis not present

## 2016-05-30 DIAGNOSIS — E669 Obesity, unspecified: Secondary | ICD-10-CM | POA: Diagnosis not present

## 2016-05-30 DIAGNOSIS — M1288 Other specific arthropathies, not elsewhere classified, other specified site: Secondary | ICD-10-CM | POA: Diagnosis not present

## 2016-05-30 DIAGNOSIS — M5137 Other intervertebral disc degeneration, lumbosacral region: Secondary | ICD-10-CM | POA: Diagnosis not present

## 2016-05-30 NOTE — Telephone Encounter (Signed)
Pt called to check the status of the PA for medication QUEtiapine (SEROQUEL) 100 MG tablet. Pt states she has not been able to sleep for 1 week.   Call pt @ [phone]. Thank you!

## 2016-05-30 NOTE — Telephone Encounter (Signed)
Need to call [place] [facility], [phone] during business hours.

## 2016-05-31 NOTE — Telephone Encounter (Signed)
PA completed on Medicaid, needs to be reviewed by pharmacy, PA # [id number]

## 2016-06-15 ENCOUNTER — Ambulatory Visit: Payer: Medicare Other | Admitting: Family Medicine

## 2016-06-15 DIAGNOSIS — M47817 Spondylosis without myelopathy or radiculopathy, lumbosacral region: Secondary | ICD-10-CM | POA: Diagnosis not present

## 2016-06-15 DIAGNOSIS — Z79899 Other long term (current) drug therapy: Secondary | ICD-10-CM | POA: Diagnosis not present

## 2016-06-15 DIAGNOSIS — Z79891 Long term (current) use of opiate analgesic: Secondary | ICD-10-CM | POA: Diagnosis not present

## 2016-06-15 DIAGNOSIS — G894 Chronic pain syndrome: Secondary | ICD-10-CM | POA: Diagnosis not present

## 2016-07-11 DIAGNOSIS — Z79891 Long term (current) use of opiate analgesic: Secondary | ICD-10-CM | POA: Diagnosis not present

## 2016-07-11 DIAGNOSIS — Z79899 Other long term (current) drug therapy: Secondary | ICD-10-CM | POA: Diagnosis not present

## 2016-07-11 DIAGNOSIS — G894 Chronic pain syndrome: Secondary | ICD-10-CM | POA: Diagnosis not present

## 2016-07-11 DIAGNOSIS — M549 Dorsalgia, unspecified: Secondary | ICD-10-CM | POA: Diagnosis not present

## 2016-07-11 DIAGNOSIS — M5137 Other intervertebral disc degeneration, lumbosacral region: Secondary | ICD-10-CM | POA: Diagnosis not present

## 2016-07-13 ENCOUNTER — Ambulatory Visit: Payer: Medicare Other | Admitting: Family Medicine

## 2016-07-14 ENCOUNTER — Encounter: Payer: Self-pay | Admitting: Family Medicine

## 2016-07-14 ENCOUNTER — Ambulatory Visit (INDEPENDENT_AMBULATORY_CARE_PROVIDER_SITE_OTHER): Payer: Medicare Other | Admitting: Family Medicine

## 2016-07-14 VITALS — BP 160/88 | HR 47 | Temp 98.7°F | Wt 205.5 lb

## 2016-07-14 DIAGNOSIS — M25531 Pain in right wrist: Secondary | ICD-10-CM | POA: Diagnosis not present

## 2016-07-14 DIAGNOSIS — I10 Essential (primary) hypertension: Secondary | ICD-10-CM | POA: Diagnosis not present

## 2016-07-14 DIAGNOSIS — I499 Cardiac arrhythmia, unspecified: Secondary | ICD-10-CM | POA: Diagnosis not present

## 2016-07-14 DIAGNOSIS — M25532 Pain in left wrist: Secondary | ICD-10-CM

## 2016-07-14 DIAGNOSIS — R739 Hyperglycemia, unspecified: Secondary | ICD-10-CM

## 2016-07-14 DIAGNOSIS — R7303 Prediabetes: Secondary | ICD-10-CM | POA: Insufficient documentation

## 2016-07-14 LAB — HEMOGLOBIN A1C: Hgb A1c MFr Bld: 6.3 % (ref 4.6–6.5)

## 2016-07-14 MED ORDER — METOPROLOL SUCCINATE ER 25 MG PO TB24: 25.0000 mg | ORAL_TABLET | Freq: Every day | ORAL | 3 refills | Status: DC

## 2016-07-14 NOTE — Progress Notes (Signed)
Subjective:  Patient ID: Meredith Mckee, female    DOB: 04-04-1971  Age: 45 y.o. MRN: [id number]  CC: Follow up HTN  HPI:  45 year old female with chronic low back pain, hypertension, hyperlipidemia presents for follow-up regarding her hypertension. She is in need of medication refill today.  HTN  Not at goal.  Patient has been out of her medication for the past few days. BBs have been elevated secondary to being out of medication.  She has previously been stable on metoprolol.  She's had palpitations recently as well.  Bilateral wrist pain  Patient also reports recent bilateral wrist pain. No reports of injury.  Mild to moderate in severity.  She is currently followed by pain management and is on chronic narcotics.  Social Hx   Social History   Social History  . Marital status: Single    Spouse name: N/A  . Number of children: N/A  . Years of education: N/A   Social History Main Topics  . Smoking status: Current Every Day Smoker    Packs/day: 1.00    Years: 32.00    Types: Cigarettes  . Smokeless tobacco: Never Used  . Alcohol use No  . Drug use: No  . Sexual activity: Not Asked   Other Topics Concern  . None   Social History Narrative  . None   Review of Systems  Cardiovascular: Positive for palpitations.  Musculoskeletal:       Wrist pain.   Objective:  BP (!) 160/88 (BP Location: Right Arm, Patient Position: Sitting, Cuff Size: Normal)   Pulse (!) 47   Temp 98.7 F (37.1 C) (Oral)   Wt 205 lb 8 oz (93.2 kg)   SpO2 98%   BMI 33.17 kg/m   BP/Weight 07/14/2016 05/12/2016 04/28/2016  Systolic BP 160 152 136  Diastolic BP 88 83 98  Wt. (Lbs) 205.5 200.5 202  BMI 33.17 32.36 32.6    Physical Exam  Constitutional: She is oriented to person, place, and time. She appears well-developed. No distress.  Cardiovascular:  Regularly irregular.  Pulmonary/Chest: Effort normal.  Musculoskeletal:  Bilateral wrist - diffusely tender to palpation. No  evidence of effusion.  Neurological: She is alert and oriented to person, place, and time.  Psychiatric: She has a normal mood and affect.  Vitals reviewed.  Lab Results  Component Value Date   WBC 6.9 05/12/2016   HGB 14.5 05/12/2016   HCT 43.8 05/12/2016   PLT 323.0 05/12/2016   GLUCOSE 101 (H) 05/12/2016   CHOL 218 (H) 03/31/2016   TRIG 184.0 (H) 03/31/2016   HDL 40.60 03/31/2016   LDLCALC 140 (H) 03/31/2016   ALT 19 05/12/2016   AST 19 05/12/2016   NA 139 05/12/2016   K 4.1 05/12/2016   CL 105 05/12/2016   CREATININE 0.83 05/12/2016   BUN 9 05/12/2016   CO2 28 05/12/2016    Assessment & Plan:   Problem List Items Addressed This Visit    Irregular heartbeat    This was noted previously on physical exam. However, today on pulse oximetry her heart rate was 47. Due to this, EKG was obtained and independently reviewed.  Interpretation - normal sinus rhythm at a rate of 84. PACs noted. Nonspecific T-wave abnormality. Restarting metoprolol.      Relevant Orders   EKG 12-Lead (Completed)   Blood glucose elevated    A1C today.      Relevant Orders   HgB A1c   Bilateral wrist pain  New problem. Unclear etiology. Likely musculoskeletal. No indication for imaging at this time. We'll continue to follow. Patient is to continue her home narcotics per her pain management physician.      Benign essential HTN    Established problem, worsening. This is due to the fact that patient has been out of her medications. We'll resume metoprolol.      Relevant Medications   metoprolol succinate (TOPROL-XL) 25 MG 24 hr tablet    Other Visit Diagnoses   None.    Follow-up: Return in about 6 months (around 01/12/2017).  Everlene Other DO [facility]

## 2016-07-14 NOTE — Assessment & Plan Note (Addendum)
This was noted previously on physical exam. However, today on pulse oximetry her heart rate was 47. Due to this, EKG was obtained and independently reviewed.  Interpretation - normal sinus rhythm at a rate of 84. PACs noted. Nonspecific T-wave abnormality. Restarting metoprolol.

## 2016-07-14 NOTE — Assessment & Plan Note (Signed)
New problem. Unclear etiology. Likely musculoskeletal. No indication for imaging at this time. We'll continue to follow. Patient is to continue her home narcotics per her pain management physician.

## 2016-07-14 NOTE — Progress Notes (Signed)
Pre visit review using our clinic review tool, if applicable. No additional management support is needed unless otherwise documented below in the visit note. 

## 2016-07-14 NOTE — Patient Instructions (Signed)
I have sent in the metoprolol.  Follow up in 2 weeks for a BP check. Then in 6 months.  Take care  Dr. Adriana Simasook

## 2016-07-14 NOTE — Assessment & Plan Note (Signed)
Established problem, worsening. This is due to the fact that patient has been out of her medications. We'll resume metoprolol.

## 2016-07-14 NOTE — Assessment & Plan Note (Signed)
A1C today. 

## 2016-08-10 DIAGNOSIS — G894 Chronic pain syndrome: Secondary | ICD-10-CM | POA: Diagnosis not present

## 2016-08-10 DIAGNOSIS — M5137 Other intervertebral disc degeneration, lumbosacral region: Secondary | ICD-10-CM | POA: Diagnosis not present

## 2016-08-10 DIAGNOSIS — Z79899 Other long term (current) drug therapy: Secondary | ICD-10-CM | POA: Diagnosis not present

## 2016-08-10 DIAGNOSIS — M25569 Pain in unspecified knee: Secondary | ICD-10-CM | POA: Diagnosis not present

## 2016-08-10 DIAGNOSIS — Z79891 Long term (current) use of opiate analgesic: Secondary | ICD-10-CM | POA: Diagnosis not present

## 2016-08-10 DIAGNOSIS — M549 Dorsalgia, unspecified: Secondary | ICD-10-CM | POA: Diagnosis not present

## 2016-08-15 DIAGNOSIS — M545 Low back pain: Secondary | ICD-10-CM | POA: Diagnosis not present

## 2016-08-15 DIAGNOSIS — M79609 Pain in unspecified limb: Secondary | ICD-10-CM | POA: Diagnosis not present

## 2016-08-30 ENCOUNTER — Encounter: Payer: Self-pay | Admitting: Family Medicine

## 2016-08-30 ENCOUNTER — Ambulatory Visit (INDEPENDENT_AMBULATORY_CARE_PROVIDER_SITE_OTHER): Payer: Medicare Other | Admitting: Family Medicine

## 2016-08-30 VITALS — BP 154/80 | HR 74 | Temp 98.2°F | Resp 14 | Wt 205.1 lb

## 2016-08-30 DIAGNOSIS — R232 Flushing: Secondary | ICD-10-CM

## 2016-08-30 DIAGNOSIS — G43909 Migraine, unspecified, not intractable, without status migrainosus: Secondary | ICD-10-CM | POA: Diagnosis not present

## 2016-08-30 MED ORDER — VENLAFAXINE HCL ER 37.5 MG PO CP24: 37.5000 mg | ORAL_CAPSULE | Freq: Every day | ORAL | 0 refills | Status: DC

## 2016-08-30 MED ORDER — SUMATRIPTAN SUCCINATE 50 MG PO TABS: ORAL_TABLET | ORAL | 0 refills | Status: DC

## 2016-08-30 MED ORDER — PROMETHAZINE HCL 25 MG/ML IJ SOLN
25.0000 mg | Freq: Once | INTRAMUSCULAR | Status: AC
  Administered 2016-08-30: 25 mg via INTRAMUSCULAR

## 2016-08-30 MED ORDER — DEXAMETHASONE SODIUM PHOSPHATE 10 MG/ML IJ SOLN: 10.0000 mg | Freq: Once | INTRAMUSCULAR | Status: DC

## 2016-08-30 MED ORDER — DEXAMETHASONE SODIUM PHOSPHATE 10 MG/ML IJ SOLN
10.0000 mg | Freq: Once | INTRAMUSCULAR | Status: AC
  Administered 2016-08-30: 10 mg via INTRAMUSCULAR

## 2016-08-30 NOTE — Progress Notes (Signed)
Subjective:  Patient ID: Meredith Mckee, female    DOB: Apr 27, 1971  Age: 45 y.o. MRN: [id number]  CC: Migraine, Hot flashes/fatigue  HPI:  45 year old female with chronic low back pain, hypertension, hyperlipidemia presents with the above complaints.  Migraine  Patient reports a prior history of migraine.  She states that for the past week she's had intermittent headache.  She states that it's worse than her migraines in the past. Worse over the past few days.  Located in the temporal regions bilaterally. No associated nausea.  She reports associated photophobia and phonophobia.  No medications tried other than her normal medications.  No known relieving factors.  She reports associated vision changes.  Hot flashes, fatigue  Patient reports a 3 week history of decreased energy/fatigue.  She denies symptoms of depression.  She does report significant hot flashes. Worse in the day.  Soaks her clothing.  No known exacerbating or relieving factors pre-  She would like to discuss treatment options today.  Social Hx   Social History   Social History  . Marital status: Single    Spouse name: N/A  . Number of children: N/A  . Years of education: N/A   Social History Main Topics  . Smoking status: Current Every Day Smoker    Packs/day: 1.00    Years: 32.00    Types: Cigarettes  . Smokeless tobacco: Never Used  . Alcohol use No  . Drug use: No  . Sexual activity: Not Asked   Other Topics Concern  . None   Social History Narrative  . None    Review of Systems  Constitutional: Positive for fatigue.       Hot flashes.  Neurological: Positive for headaches.   Objective:  BP (!) 154/80 (BP Location: Left Arm, Patient Position: Sitting, Cuff Size: Normal)   Pulse 74   Temp 98.2 F (36.8 C) (Oral)   Resp 14   Wt 205 lb 2 oz (93 kg)   SpO2 99%   BMI 33.11 kg/m   BP/Weight 08/30/2016 07/14/2016 05/12/2016  Systolic BP 154 160 152  Diastolic BP 80 88  83  Wt. (Lbs) 205.13 205.5 200.5  BMI 33.11 33.17 32.36   Physical Exam  Constitutional: She is oriented to person, place, and time. She appears well-developed. No distress.  Eyes: Pupils are equal, round, and reactive to light.  Cardiovascular: Normal rate.  An irregular rhythm present.  Irregular rhythm noted previously. Secondary to ectopy.  Pulmonary/Chest: Effort normal and breath sounds normal.  Neurological: She is alert and oriented to person, place, and time.  No apparent focal deficits. Photophobic.  Psychiatric:  Flat affect.  Vitals reviewed.  Lab Results  Component Value Date   WBC 6.9 05/12/2016   HGB 14.5 05/12/2016   HCT 43.8 05/12/2016   PLT 323.0 05/12/2016   GLUCOSE 101 (H) 05/12/2016   CHOL 218 (H) 03/31/2016   TRIG 184.0 (H) 03/31/2016   HDL 40.60 03/31/2016   LDLCALC 140 (H) 03/31/2016   ALT 19 05/12/2016   AST 19 05/12/2016   NA 139 05/12/2016   K 4.1 05/12/2016   CL 105 05/12/2016   CREATININE 0.83 05/12/2016   BUN 9 05/12/2016   CO2 28 05/12/2016   HGBA1C 6.3 07/14/2016    Assessment & Plan:   Problem List Items Addressed This Visit    Migraine - Primary    New problem (to me). IM Phenergan and Decadron given today. Treating with oral Imitrex. Has follow-up in January.  Relevant Medications   SUMAtriptan (IMITREX) 50 MG tablet   venlafaxine XR (EFFEXOR-XR) 37.5 MG 24 hr capsule   promethazine (PHENERGAN) injection 25 mg (Completed)   dexamethasone (DECADRON) injection 10 mg (Completed)   Hot flashes    Worsening. Trial of Effexor.        Meds ordered this encounter  Medications  . SUMAtriptan (IMITREX) 50 MG tablet    Sig: 1 tablet once. May repeat in 2 hours if headache persists or recurs.    Dispense:  10 tablet    Refill:  0  . venlafaxine XR (EFFEXOR-XR) 37.5 MG 24 hr capsule    Sig: Take 1 capsule (37.5 mg total) by mouth daily. Increase to 75 mg daily after 1 week.    Dispense:  90 capsule    Refill:  0  .  promethazine (PHENERGAN) injection 25 mg  . DISCONTD: dexamethasone (DECADRON) injection 10 mg  . dexamethasone (DECADRON) injection 10 mg   Follow-up: January  Everlene OtherJayce Noland Pizano DO [facility] [place]

## 2016-08-30 NOTE — Patient Instructions (Signed)
Follow up in Jan.  Take meds as prescribed.  Call with concerns.  Take care  Dr. Adriana Simasook

## 2016-08-30 NOTE — Assessment & Plan Note (Signed)
Worsening. Trial of Effexor.

## 2016-08-30 NOTE — Assessment & Plan Note (Signed)
New problem (to me). IM Phenergan and Decadron given today. Treating with oral Imitrex. Has follow-up in January.

## 2016-09-07 DIAGNOSIS — M25569 Pain in unspecified knee: Secondary | ICD-10-CM | POA: Diagnosis not present

## 2016-09-07 DIAGNOSIS — Z79891 Long term (current) use of opiate analgesic: Secondary | ICD-10-CM | POA: Diagnosis not present

## 2016-09-07 DIAGNOSIS — M549 Dorsalgia, unspecified: Secondary | ICD-10-CM | POA: Diagnosis not present

## 2016-09-07 DIAGNOSIS — G894 Chronic pain syndrome: Secondary | ICD-10-CM | POA: Diagnosis not present

## 2016-09-07 DIAGNOSIS — M5137 Other intervertebral disc degeneration, lumbosacral region: Secondary | ICD-10-CM | POA: Diagnosis not present

## 2016-09-07 DIAGNOSIS — Z79899 Other long term (current) drug therapy: Secondary | ICD-10-CM | POA: Diagnosis not present

## 2016-09-21 DIAGNOSIS — M47817 Spondylosis without myelopathy or radiculopathy, lumbosacral region: Secondary | ICD-10-CM | POA: Diagnosis not present

## 2016-10-11 DIAGNOSIS — Z79891 Long term (current) use of opiate analgesic: Secondary | ICD-10-CM | POA: Diagnosis not present

## 2016-10-11 DIAGNOSIS — M549 Dorsalgia, unspecified: Secondary | ICD-10-CM | POA: Diagnosis not present

## 2016-10-11 DIAGNOSIS — M47817 Spondylosis without myelopathy or radiculopathy, lumbosacral region: Secondary | ICD-10-CM | POA: Diagnosis not present

## 2016-10-11 DIAGNOSIS — M5137 Other intervertebral disc degeneration, lumbosacral region: Secondary | ICD-10-CM | POA: Diagnosis not present

## 2016-10-11 DIAGNOSIS — M25569 Pain in unspecified knee: Secondary | ICD-10-CM | POA: Diagnosis not present

## 2016-10-11 DIAGNOSIS — Z79899 Other long term (current) drug therapy: Secondary | ICD-10-CM | POA: Diagnosis not present

## 2016-10-11 DIAGNOSIS — G894 Chronic pain syndrome: Secondary | ICD-10-CM | POA: Diagnosis not present

## 2016-10-19 ENCOUNTER — Ambulatory Visit: Payer: Medicare Other | Admitting: Family Medicine

## 2016-10-23 ENCOUNTER — Telehealth: Payer: Self-pay | Admitting: Family Medicine

## 2016-10-23 NOTE — Telephone Encounter (Signed)
I called pt regarding Mychart message that was sent. Pt number is not working I left pt a message. Pt would like a appt regarding getting office visit. Thank you!

## 2016-10-24 ENCOUNTER — Ambulatory Visit (INDEPENDENT_AMBULATORY_CARE_PROVIDER_SITE_OTHER): Payer: Medicare Other | Admitting: Family Medicine

## 2016-10-24 ENCOUNTER — Encounter: Payer: Self-pay | Admitting: Family Medicine

## 2016-10-24 DIAGNOSIS — I10 Essential (primary) hypertension: Secondary | ICD-10-CM | POA: Diagnosis not present

## 2016-10-24 DIAGNOSIS — R232 Flushing: Secondary | ICD-10-CM | POA: Diagnosis not present

## 2016-10-24 DIAGNOSIS — G4700 Insomnia, unspecified: Secondary | ICD-10-CM | POA: Diagnosis not present

## 2016-10-24 MED ORDER — METOPROLOL SUCCINATE ER 25 MG PO TB24: 25.0000 mg | ORAL_TABLET | Freq: Every day | ORAL | 3 refills | Status: DC

## 2016-10-24 MED ORDER — ZOLPIDEM TARTRATE 5 MG PO TABS: 5.0000 mg | ORAL_TABLET | Freq: Every evening | ORAL | 1 refills | Status: DC | PRN

## 2016-10-24 MED ORDER — VENLAFAXINE HCL ER 75 MG PO CP24: 75.0000 mg | ORAL_CAPSULE | Freq: Every day | ORAL | 1 refills | Status: DC

## 2016-10-24 NOTE — Assessment & Plan Note (Signed)
Not at goal. Has not had medication. Refilled metoprolol today.

## 2016-10-24 NOTE — Assessment & Plan Note (Signed)
Uncontrolled. Increasing Effexor.

## 2016-10-24 NOTE — Patient Instructions (Signed)
Follow up in 3-6 months.  Medications as prescribed.  Take care  Dr. Eliezer Khawaja  

## 2016-10-24 NOTE — Progress Notes (Signed)
Subjective:  Patient ID: Meredith Mckee, female    DOB: 17-Aug-1971  Age: 46 y.o. MRN: [id number]  CC: Follow up HTN; Insomnia, Hot flashes  HPI:  46 year old female presents for follow up. Concerns/issues are below.  HTN  BP elevated today.  Patient has not taken her blood pressure medication for the past 4 days. She has been out of her medication.  She thinks that she may not need this medication. She would like to discuss this today.  Insomnia  Struggling with insomnia.  Seroquel not working.  Has had good results with ambien in the past.  Wants to discuss.  Hot flashes  Continue to be an issue.  She is currently taking Effexor and has not had a significant amount improvement.  Social Hx   Social History   Social History  . Marital status: Single    Spouse name: N/A  . Number of children: N/A  . Years of education: N/A   Social History Main Topics  . Smoking status: Current Every Day Smoker    Packs/day: 1.00    Years: 32.00    Types: Cigarettes  . Smokeless tobacco: Never Used  . Alcohol use No  . Drug use: No  . Sexual activity: Not Asked   Other Topics Concern  . None   Social History Narrative  . None    Review of Systems  Constitutional:       Hot flashes.  Psychiatric/Behavioral: Positive for sleep disturbance.   Objective:  BP (!) 144/84   Pulse 80   Temp 98.4 F (36.9 C) (Oral)   Wt 204 lb 6.4 oz (92.7 kg)   SpO2 100%   BMI 32.99 kg/m   BP/Weight 10/24/2016 08/30/2016 07/14/2016  Systolic BP 144 154 160  Diastolic BP 84 80 88  Wt. (Lbs) 204.4 205.13 205.5  BMI 32.99 33.11 33.17    Physical Exam  Constitutional: She is oriented to person, place, and time. She appears well-developed. No distress.  Cardiovascular: Normal rate.   Irregular (PAC's)  Pulmonary/Chest: Effort normal and breath sounds normal.  Neurological: She is alert and oriented to person, place, and time.  Psychiatric: She has a normal mood and affect.    Vitals reviewed.  Lab Results  Component Value Date   WBC 6.9 05/12/2016   HGB 14.5 05/12/2016   HCT 43.8 05/12/2016   PLT 323.0 05/12/2016   GLUCOSE 101 (H) 05/12/2016   CHOL 218 (H) 03/31/2016   TRIG 184.0 (H) 03/31/2016   HDL 40.60 03/31/2016   LDLCALC 140 (H) 03/31/2016   ALT 19 05/12/2016   AST 19 05/12/2016   NA 139 05/12/2016   K 4.1 05/12/2016   CL 105 05/12/2016   CREATININE 0.83 05/12/2016   BUN 9 05/12/2016   CO2 28 05/12/2016   HGBA1C 6.3 07/14/2016   Assessment & Plan:   Problem List Items Addressed This Visit    Insomnia    Stopping Seroquel and switching to Ambien.      Hot flashes    Uncontrolled. Increasing Effexor.      Relevant Medications   metoprolol succinate (TOPROL-XL) 25 MG 24 hr tablet   Benign essential HTN    Not at goal. Has not had medication. Refilled metoprolol today.      Relevant Medications   metoprolol succinate (TOPROL-XL) 25 MG 24 hr tablet      Meds ordered this encounter  Medications  . DISCONTD: venlafaxine XR (EFFEXOR-XR) 75 MG 24 hr capsule  Sig: Take 1 capsule (75 mg total) by mouth daily. Increase to 75 mg daily after 1 week.    Dispense:  90 capsule    Refill:  1  . zolpidem (AMBIEN) 5 MG tablet    Sig: Take 1 tablet (5 mg total) by mouth at bedtime as needed for sleep.    Dispense:  30 tablet    Refill:  1  . metoprolol succinate (TOPROL-XL) 25 MG 24 hr tablet    Sig: Take 1 tablet (25 mg total) by mouth daily.    Dispense:  90 tablet    Refill:  3  . venlafaxine XR (EFFEXOR-XR) 75 MG 24 hr capsule    Sig: Take 1 capsule (75 mg total) by mouth daily.    Dispense:  90 capsule    Refill:  1    Follow-up: 3-6 months  Carly Applegate Adriana Simas DO [facility]

## 2016-10-24 NOTE — Progress Notes (Signed)
Pre visit review using our clinic review tool, if applicable. No additional management support is needed unless otherwise documented below in the visit note. 

## 2016-10-24 NOTE — Assessment & Plan Note (Signed)
Stopping Seroquel and switching to Ambien.

## 2016-10-29 ENCOUNTER — Emergency Department: Payer: Medicare Other

## 2016-10-29 ENCOUNTER — Emergency Department
Admission: EM | Admit: 2016-10-29 | Discharge: 2016-10-29 | Disposition: A | Discharge status: Home / Self Care | Payer: Medicare Other | Attending: Emergency Medicine | Admitting: Emergency Medicine

## 2016-10-29 DIAGNOSIS — I10 Essential (primary) hypertension: Secondary | ICD-10-CM | POA: Diagnosis not present

## 2016-10-29 DIAGNOSIS — M25511 Pain in right shoulder: Secondary | ICD-10-CM | POA: Diagnosis not present

## 2016-10-29 DIAGNOSIS — Z79899 Other long term (current) drug therapy: Secondary | ICD-10-CM | POA: Diagnosis not present

## 2016-10-29 DIAGNOSIS — F17210 Nicotine dependence, cigarettes, uncomplicated: Secondary | ICD-10-CM | POA: Diagnosis not present

## 2016-10-29 MED ORDER — LIDOCAINE 5 % EX PTCH
1.0000 | MEDICATED_PATCH | CUTANEOUS | Status: DC
  Administered 2016-10-29: 1 via TRANSDERMAL
  Filled 2016-10-29: qty 1

## 2016-10-29 MED ORDER — LIDOCAINE 5 % EX PTCH: 1.0000 | MEDICATED_PATCH | Freq: Two times a day (BID) | CUTANEOUS | 0 refills | Status: DC

## 2016-10-29 MED ORDER — DIAZEPAM 2 MG PO TABS
2.0000 mg | ORAL_TABLET | Freq: Three times a day (TID) | ORAL | 0 refills | Status: DC | PRN
Start: 2016-10-29 — End: 2016-11-10

## 2016-10-29 MED ORDER — MORPHINE SULFATE (PF) 4 MG/ML IV SOLN
4.0000 mg | Freq: Once | INTRAVENOUS | Status: AC
  Administered 2016-10-29: 4 mg via INTRAMUSCULAR
  Filled 2016-10-29: qty 1

## 2016-10-29 MED ORDER — DIAZEPAM 2 MG PO TABS
2.0000 mg | ORAL_TABLET | Freq: Once | ORAL | Status: AC
Start: 2016-10-29 — End: 2016-10-29
  Administered 2016-10-29: 2 mg via ORAL
  Filled 2016-10-29: qty 1

## 2016-10-29 NOTE — ED Provider Notes (Signed)
[facility] Emergency Department Provider Note   ____________________________________________   First MD Initiated Contact with Patient 10/29/16 [phone]     (approximate)  I have reviewed the triage vital signs and the nursing notes.   HISTORY  Chief Complaint Shoulder Pain    HPI Meredith Mckee is a 46 y.o. female who comes into the hospital today with some right-sided shoulder pain. The patient reports that she had been braiding her daughter's hair and felt a twinge in her shoulder. She reports that later the night she could not move her shoulder. She reports that she's had surgery on the left but nothing on the right in the past. She denies any falls or trauma. She denies overworking although she states that 2 weeks ago she braided her own hair.The patient reports that she did not take anything for pain. She rates the pain 10 out of 10 in intensity. She reports that she is unable to move her arm due to pain. She reports that she can move her fingers but that's about it. The patient is here today for evaluation.   Past Medical History:  Diagnosis Date  . Depression   . GERD (gastroesophageal reflux disease)   . Hyperlipidemia   . Hypertension   . Multilevel degenerative disc disease     Patient Active Problem List   Diagnosis Date Noted  . Migraine 08/30/2016  . Hot flashes 08/30/2016  . Irregular heartbeat 07/14/2016  . Prediabetes 07/14/2016  . Chronic low back pain 05/12/2016  . Benign essential HTN 03/31/2016  . Hyperlipidemia 03/31/2016  . Obesity (BMI 30.0-34.9) 03/31/2016  . Insomnia 03/31/2016    Past Surgical History:  Procedure Laterality Date  . ABDOMINAL HYSTERECTOMY    . CHOLECYSTECTOMY    . ELBOW SURGERY Right    Ulnar nerve release  . JOINT REPLACEMENT     Great toes  . OOPHORECTOMY    . OTHER SURGICAL HISTORY     Reports surgery on R Femur for cyst; states rod was placed.  Marland Kitchen ROTATOR CUFF REPAIR Left     Prior to  Admission medications   Medication Sig Start Date End Date Taking? Authorizing Provider  diazepam (VALIUM) 2 MG tablet Take 1 tablet (2 mg total) by mouth every 8 (eight) hours as needed for anxiety. 10/29/16 10/29/17  Rebecka Apley, MD  lidocaine (LIDODERM) 5 % Place 1 patch onto the skin every 12 (twelve) hours. Remove & Discard patch within 12 hours or as directed by MD 10/29/16 10/29/17  Rebecka Apley, MD  metoprolol succinate (TOPROL-XL) 25 MG 24 hr tablet Take 1 tablet (25 mg total) by mouth daily. 10/24/16   Tommie Sams, DO  oxyCODONE-acetaminophen (PERCOCET) 10-325 MG tablet Take 1 tablet by mouth every 6 (six) hours as needed for pain. 05/12/16   Tommie Sams, DO  SUMAtriptan (IMITREX) 50 MG tablet 1 tablet once. May repeat in 2 hours if headache persists or recurs. 08/30/16   Tommie Sams, DO  tiZANidine (ZANAFLEX) 4 MG tablet Take 1 tablet (4 mg total) by mouth every 6 (six) hours as needed for muscle spasms. 03/31/16   Tommie Sams, DO  venlafaxine XR (EFFEXOR-XR) 75 MG 24 hr capsule Take 1 capsule (75 mg total) by mouth daily. 10/24/16   Tommie Sams, DO  zolpidem (AMBIEN) 5 MG tablet Take 1 tablet (5 mg total) by mouth at bedtime as needed for sleep. 10/24/16   Tommie Sams, DO    Allergies Ibuprofen  and Tylenol [acetaminophen]  Family History  Problem Relation Age of Onset  . Lung cancer Mother   . Alzheimer's disease Mother   . Diabetes Father   . Lung cancer Father   . Heart disease Father   . Diabetes Maternal Grandmother     Social History Social History  Substance Use Topics  . Smoking status: Current Every Day Smoker    Packs/day: 1.00    Years: 32.00    Types: Cigarettes  . Smokeless tobacco: Never Used  . Alcohol use No    Review of Systems Constitutional: No fever/chills Eyes: No visual changes. ENT: No sore throat. Cardiovascular: Denies chest pain. Respiratory: Denies shortness of breath. Gastrointestinal: No abdominal pain.  No nausea, no vomiting.   No diarrhea.  No constipation. Genitourinary: Negative for dysuria. Musculoskeletal: right shoulder pain Skin: Negative for rash. Neurological: Negative for headaches, focal weakness or numbness.  10-point ROS otherwise negative.  ____________________________________________   PHYSICAL EXAM:  VITAL SIGNS: ED Triage Vitals  Enc Vitals Group     BP 10/29/16 0310 (!) 146/100     Pulse Rate 10/29/16 0310 81     Resp 10/29/16 0310 18     Temp 10/29/16 0310 98.6 F (37 C)     Temp Source 10/29/16 0310 Oral     SpO2 10/29/16 0310 95 %     Weight 10/29/16 0247 204 lb (92.5 kg)     Height 10/29/16 0310 5\' 6"  (1.676 m)     Head Circumference --      Peak Flow --      Pain Score 10/29/16 0248 10     Pain Loc --      Pain Edu? --      Excl. in GC? --     Constitutional: Alert and oriented. Well appearing and in moderate distress. Eyes: Conjunctivae are normal. PERRL. EOMI. Head: Atraumatic. Nose: No congestion/rhinnorhea. Mouth/Throat: Mucous membranes are moist.  Oropharynx non-erythematous. Neck: No cervical spine tenderness to palpation. Cardiovascular: Normal rate, regular rhythm. Grossly normal heart sounds.  Good peripheral circulation. Respiratory: Normal respiratory effort.  No retractions. Lungs CTAB. Gastrointestinal: Soft and nontender. No distention. Positive bowel sounds Musculoskeletal: Tenderness to palpation along the right trapezius as well as along the right anterior shoulder. Patient has no pain posteriorly or along the humeral head. The patient has pain with passive range of motion abduction, flexion or extension at the shoulder.. Neurologic:  Normal speech and language.  Skin:  Skin is warm, dry and intact.  Psychiatric: Mood and affect are normal.   ____________________________________________   LABS (all labs ordered are listed, but only abnormal results are displayed)  Labs Reviewed - No data to  display ____________________________________________  EKG  none ____________________________________________  RADIOLOGY  Right shoulder xray ____________________________________________   PROCEDURES  Procedure(s) performed: None  Procedures  Critical Care performed: No  ____________________________________________   INITIAL IMPRESSION / ASSESSMENT AND PLAN / ED COURSE  Pertinent labs & imaging results that were available during my care of the patient were reviewed by me and considered in my medical decision making (see chart for details).  This is a 46 year old female who comes into the hospital today with some right shoulder pain. I did send the patient for x-ray which was negative. I gave the patient a shot of morphine a Lidoderm patch as well as some Valium. The patient's pain was improved. I will place the patient in a sling and I will have her follow-up with orthopedic surgery. The patient has  no further complaints at this time. She will be discharged to home to follow-up.  Clinical Course as of Oct 29 636  Wynelle LinkSun Oct 29, 2016  [id number] No acute osseous abnormality about the right shoulder. DG Shoulder Right [AW]    Clinical Course User Index [AW] Rebecka ApleyAllison P Marlee Armenteros, MD     ____________________________________________   FINAL CLINICAL IMPRESSION(S) / ED DIAGNOSES  Final diagnoses:  Acute pain of right shoulder      NEW MEDICATIONS STARTED DURING THIS VISIT:  New Prescriptions   DIAZEPAM (VALIUM) 2 MG TABLET    Take 1 tablet (2 mg total) by mouth every 8 (eight) hours as needed for anxiety.   LIDOCAINE (LIDODERM) 5 %    Place 1 patch onto the skin every 12 (twelve) hours. Remove & Discard patch within 12 hours or as directed by MD     Note:  This document was prepared using Dragon voice recognition software and may include unintentional dictation errors.    Rebecka ApleyAllison P Eren Ryser, MD 10/29/16 [phone]

## 2016-10-29 NOTE — ED Triage Notes (Signed)
Pt says she was braiding her daughter's hair when she noticed a "twinge" in her right shoulder; now shoulder too painful to move arm; pt says her other daughter had to help her get dressed;

## 2016-10-30 NOTE — Telephone Encounter (Signed)
Pt lvm stating that she was [facility] for [facility] pain. She was told that she needed to follow up with [facility] Joice [facility]oggi at [facility]. She called to schedule with him but was told that she needed a referral from her pcp. Please advise.

## 2016-10-30 NOTE — Telephone Encounter (Signed)
Per chart she has not spoke with you about this. recommendations?

## 2016-10-31 ENCOUNTER — Other Ambulatory Visit: Payer: Self-pay | Admitting: Family Medicine

## 2016-10-31 DIAGNOSIS — M25511 Pain in right shoulder: Secondary | ICD-10-CM

## 2016-11-01 ENCOUNTER — Telehealth: Payer: Self-pay | Admitting: *Deleted

## 2016-11-01 DIAGNOSIS — Z1239 Encounter for other screening for malignant neoplasm of breast: Secondary | ICD-10-CM

## 2016-11-01 NOTE — Telephone Encounter (Signed)
Pt requested a order for a mammogram

## 2016-11-01 NOTE — Telephone Encounter (Signed)
Pt was called and told that her order was placed for mammogram. Pt will call to schedule.

## 2016-11-09 ENCOUNTER — Telehealth: Payer: Self-pay | Admitting: Family Medicine

## 2016-11-09 DIAGNOSIS — Z79899 Other long term (current) drug therapy: Secondary | ICD-10-CM | POA: Diagnosis not present

## 2016-11-09 DIAGNOSIS — G894 Chronic pain syndrome: Secondary | ICD-10-CM | POA: Diagnosis not present

## 2016-11-09 DIAGNOSIS — M5137 Other intervertebral disc degeneration, lumbosacral region: Secondary | ICD-10-CM | POA: Diagnosis not present

## 2016-11-09 DIAGNOSIS — M25569 Pain in unspecified knee: Secondary | ICD-10-CM | POA: Diagnosis not present

## 2016-11-09 DIAGNOSIS — M549 Dorsalgia, unspecified: Secondary | ICD-10-CM | POA: Diagnosis not present

## 2016-11-09 DIAGNOSIS — Z79891 Long term (current) use of opiate analgesic: Secondary | ICD-10-CM | POA: Diagnosis not present

## 2016-11-09 NOTE — Telephone Encounter (Signed)
Led and stated that her Meredith Mckee is not working. Pt states that her left ear is itching and is in pain. Pt stated that it feels full. She is scheduled for a acute tomorrow at 4 p.m.

## 2016-11-10 ENCOUNTER — Encounter: Payer: Self-pay | Admitting: Family Medicine

## 2016-11-10 ENCOUNTER — Ambulatory Visit (INDEPENDENT_AMBULATORY_CARE_PROVIDER_SITE_OTHER): Payer: Medicare Other | Admitting: Family Medicine

## 2016-11-10 DIAGNOSIS — H9203 Otalgia, bilateral: Secondary | ICD-10-CM | POA: Diagnosis not present

## 2016-11-10 MED ORDER — ZOLPIDEM TARTRATE 10 MG PO TABS: 10.0000 mg | ORAL_TABLET | Freq: Every evening | ORAL | 3 refills | Status: DC | PRN

## 2016-11-10 MED ORDER — NEOMYCIN-POLYMYXIN-HC 1 % OT SOLN: 4.0000 [drp] | Freq: Four times a day (QID) | OTIC | 0 refills | Status: DC

## 2016-11-10 NOTE — Progress Notes (Signed)
Pre visit review using our clinic review tool, if applicable. No additional management support is needed unless otherwise documented below in the visit note. 

## 2016-11-10 NOTE — Patient Instructions (Signed)
Try the drops for the next 5-7 days.  If it persists let me know.  I have increased the ambien.  Take care  Dr. Adriana Simasook

## 2016-11-12 DIAGNOSIS — H9203 Otalgia, bilateral: Secondary | ICD-10-CM | POA: Insufficient documentation

## 2016-11-12 NOTE — Assessment & Plan Note (Signed)
New problem. Uncertain etiology. No evident Otitis media. Treating empirically with Cortisporin. If persists, will need to see ENT.

## 2016-11-12 NOTE — Progress Notes (Signed)
   Subjective:  Patient ID: Meredith Mckee, female    DOB: 31-Jan-1971  Age: 46 y.o. MRN: [id number]  CC: Ear pain  HPI:  46 year old female presents with ear pain.  Ear pain  Bilateral.  X 1.5 weeks.  No recent illness.  No other associated symptoms.  Moderate in severity.  No known exacerbating or relieving factors.  Social Hx   Social History   Social History  . Marital status: Single    Spouse name: N/A  . Number of children: N/A  . Years of education: N/A   Social History Main Topics  . Smoking status: Current Every Day Smoker    Packs/day: 1.00    Years: 32.00    Types: Cigarettes  . Smokeless tobacco: Never Used  . Alcohol use No  . Drug use: No  . Sexual activity: Not Asked   Other Topics Concern  . None   Social History Narrative  . None    Review of Systems  Constitutional: Negative.   HENT: Positive for ear pain.    Objective:  BP (!) 150/97   Pulse 71   Temp 98.2 F (36.8 C) (Oral)   Wt 203 lb 3.2 oz (92.2 kg)   SpO2 97%   BMI 32.80 kg/m   BP/Weight 11/10/2016 10/29/2016 10/24/2016  Systolic BP 150 152 144  Diastolic BP 97 105 84  Wt. (Lbs) 203.2 196 204.4  BMI 32.8 31.64 32.99    Physical Exam  Constitutional: She is oriented to person, place, and time. She appears well-developed. No distress.  HENT:  Canals and TM's normal bilaterally.   Pulmonary/Chest: Effort normal.  Neurological: She is alert and oriented to person, place, and time.  Psychiatric: She has a normal mood and affect.  Vitals reviewed.  Lab Results  Component Value Date   WBC 6.9 05/12/2016   HGB 14.5 05/12/2016   HCT 43.8 05/12/2016   PLT 323.0 05/12/2016   GLUCOSE 101 (H) 05/12/2016   CHOL 218 (H) 03/31/2016   TRIG 184.0 (H) 03/31/2016   HDL 40.60 03/31/2016   LDLCALC 140 (H) 03/31/2016   ALT 19 05/12/2016   AST 19 05/12/2016   NA 139 05/12/2016   K 4.1 05/12/2016   CL 105 05/12/2016   CREATININE 0.83 05/12/2016   BUN 9 05/12/2016   CO2 28  05/12/2016   HGBA1C 6.3 07/14/2016    Assessment & Plan:   Problem List Items Addressed This Visit    Ear pain, bilateral    New problem. Uncertain etiology. No evident Otitis media. Treating empirically with Cortisporin. If persists, will need to see ENT.         Meds ordered this encounter  Medications  . zolpidem (AMBIEN) 10 MG tablet    Sig: Take 1 tablet (10 mg total) by mouth at bedtime as needed for sleep.    Dispense:  30 tablet    Refill:  3  . NEOMYCIN-POLYMYXIN-HYDROCORTISONE (CORTISPORIN) 1 % SOLN otic solution    Sig: Place 4 drops into both ears every 6 (six) hours.    Dispense:  10 mL    Refill:  0    Follow-up: PRN  Everlene OtherJayce Starsky Nanna DO [facility] [place]

## 2016-11-17 DIAGNOSIS — M7581 Other shoulder lesions, right shoulder: Secondary | ICD-10-CM | POA: Diagnosis not present

## 2016-11-17 DIAGNOSIS — M25511 Pain in right shoulder: Secondary | ICD-10-CM | POA: Diagnosis not present

## 2016-11-20 ENCOUNTER — Other Ambulatory Visit: Payer: Self-pay | Admitting: Student

## 2016-11-20 DIAGNOSIS — M25511 Pain in right shoulder: Secondary | ICD-10-CM

## 2016-11-24 ENCOUNTER — Ambulatory Visit
Admission: RE | Admit: 2016-11-24 | Discharge: 2016-11-24 | Disposition: A | Discharge status: Home / Self Care | Payer: Medicare Other | Source: Ambulatory Visit | Attending: Student | Admitting: Student

## 2016-11-24 DIAGNOSIS — M7551 Bursitis of right shoulder: Secondary | ICD-10-CM | POA: Insufficient documentation

## 2016-11-24 DIAGNOSIS — M75101 Unspecified rotator cuff tear or rupture of right shoulder, not specified as traumatic: Secondary | ICD-10-CM | POA: Insufficient documentation

## 2016-11-24 DIAGNOSIS — M19011 Primary osteoarthritis, right shoulder: Secondary | ICD-10-CM | POA: Insufficient documentation

## 2016-11-24 DIAGNOSIS — M25511 Pain in right shoulder: Secondary | ICD-10-CM | POA: Diagnosis not present

## 2016-11-24 DIAGNOSIS — M7581 Other shoulder lesions, right shoulder: Secondary | ICD-10-CM | POA: Diagnosis not present

## 2016-11-29 DIAGNOSIS — M7551 Bursitis of right shoulder: Secondary | ICD-10-CM | POA: Diagnosis not present

## 2016-11-29 DIAGNOSIS — M7581 Other shoulder lesions, right shoulder: Secondary | ICD-10-CM | POA: Diagnosis not present

## 2016-11-29 DIAGNOSIS — M19019 Primary osteoarthritis, unspecified shoulder: Secondary | ICD-10-CM | POA: Diagnosis not present

## 2016-12-07 DIAGNOSIS — Z79899 Other long term (current) drug therapy: Secondary | ICD-10-CM | POA: Diagnosis not present

## 2016-12-07 DIAGNOSIS — M25569 Pain in unspecified knee: Secondary | ICD-10-CM | POA: Diagnosis not present

## 2016-12-07 DIAGNOSIS — M5137 Other intervertebral disc degeneration, lumbosacral region: Secondary | ICD-10-CM | POA: Diagnosis not present

## 2016-12-07 DIAGNOSIS — Z79891 Long term (current) use of opiate analgesic: Secondary | ICD-10-CM | POA: Diagnosis not present

## 2016-12-07 DIAGNOSIS — G894 Chronic pain syndrome: Secondary | ICD-10-CM | POA: Diagnosis not present

## 2016-12-26 ENCOUNTER — Emergency Department: Payer: Medicare Other

## 2016-12-26 ENCOUNTER — Observation Stay
Admission: EM | Admit: 2016-12-26 | Discharge: 2016-12-27 | Disposition: A | Discharge status: Home / Self Care | Payer: Medicare Other | Attending: Internal Medicine | Admitting: Internal Medicine

## 2016-12-26 ENCOUNTER — Encounter: Payer: Self-pay | Admitting: Intensive Care

## 2016-12-26 DIAGNOSIS — F419 Anxiety disorder, unspecified: Secondary | ICD-10-CM | POA: Insufficient documentation

## 2016-12-26 DIAGNOSIS — R0789 Other chest pain: Secondary | ICD-10-CM | POA: Diagnosis not present

## 2016-12-26 DIAGNOSIS — E669 Obesity, unspecified: Secondary | ICD-10-CM | POA: Insufficient documentation

## 2016-12-26 DIAGNOSIS — R002 Palpitations: Secondary | ICD-10-CM | POA: Diagnosis not present

## 2016-12-26 DIAGNOSIS — K219 Gastro-esophageal reflux disease without esophagitis: Secondary | ICD-10-CM | POA: Diagnosis not present

## 2016-12-26 DIAGNOSIS — Z6831 Body mass index (BMI) 31.0-31.9, adult: Secondary | ICD-10-CM | POA: Diagnosis not present

## 2016-12-26 DIAGNOSIS — Z716 Tobacco abuse counseling: Secondary | ICD-10-CM | POA: Diagnosis not present

## 2016-12-26 DIAGNOSIS — N289 Disorder of kidney and ureter, unspecified: Secondary | ICD-10-CM | POA: Diagnosis not present

## 2016-12-26 DIAGNOSIS — Z79891 Long term (current) use of opiate analgesic: Secondary | ICD-10-CM | POA: Insufficient documentation

## 2016-12-26 DIAGNOSIS — F329 Major depressive disorder, single episode, unspecified: Secondary | ICD-10-CM | POA: Insufficient documentation

## 2016-12-26 DIAGNOSIS — I10 Essential (primary) hypertension: Secondary | ICD-10-CM | POA: Diagnosis not present

## 2016-12-26 DIAGNOSIS — G43909 Migraine, unspecified, not intractable, without status migrainosus: Secondary | ICD-10-CM | POA: Diagnosis not present

## 2016-12-26 DIAGNOSIS — F17210 Nicotine dependence, cigarettes, uncomplicated: Secondary | ICD-10-CM | POA: Insufficient documentation

## 2016-12-26 DIAGNOSIS — Z8249 Family history of ischemic heart disease and other diseases of the circulatory system: Secondary | ICD-10-CM | POA: Diagnosis not present

## 2016-12-26 DIAGNOSIS — G4700 Insomnia, unspecified: Secondary | ICD-10-CM | POA: Diagnosis not present

## 2016-12-26 DIAGNOSIS — R079 Chest pain, unspecified: Secondary | ICD-10-CM | POA: Diagnosis not present

## 2016-12-26 DIAGNOSIS — G8929 Other chronic pain: Secondary | ICD-10-CM | POA: Diagnosis not present

## 2016-12-26 DIAGNOSIS — M545 Low back pain: Secondary | ICD-10-CM | POA: Diagnosis not present

## 2016-12-26 DIAGNOSIS — E785 Hyperlipidemia, unspecified: Secondary | ICD-10-CM | POA: Insufficient documentation

## 2016-12-26 DIAGNOSIS — Z79899 Other long term (current) drug therapy: Secondary | ICD-10-CM | POA: Diagnosis not present

## 2016-12-26 LAB — BASIC METABOLIC PANEL
Anion gap: 6 (ref 5–15)
BUN: 8 mg/dL (ref 6–20)
CO2: 25 mmol/L (ref 22–32)
CREATININE: 1.1 mg/dL — AB (ref 0.44–1.00)
Calcium: 9.4 mg/dL (ref 8.9–10.3)
Chloride: 108 mmol/L (ref 101–111)
GFR, EST NON AFRICAN AMERICAN: 60 mL/min — AB (ref >60)
Glucose, Bld: 92 mg/dL (ref 65–99)
POTASSIUM: 4.1 mmol/L (ref 3.5–5.1)
Sodium: 139 mmol/L (ref 135–145)

## 2016-12-26 LAB — CBC
HCT: 43.3 % (ref 35.0–47.0)
Hemoglobin: 14.4 g/dL (ref 12.0–16.0)
MCH: 28.6 pg (ref 26.0–34.0)
MCHC: 33.2 g/dL (ref 32.0–36.0)
MCV: 86.1 fL (ref 80.0–100.0)
PLATELETS: 343 10*3/uL (ref 150–440)
RBC: 5.03 MIL/uL (ref 3.80–5.20)
RDW: 13 % (ref 11.5–14.5)
WBC: 7.2 10*3/uL (ref 3.6–11.0)

## 2016-12-26 LAB — MAGNESIUM: Magnesium: 2 mg/dL (ref 1.7–2.4)

## 2016-12-26 LAB — HEPATIC FUNCTION PANEL
ALBUMIN: 3.8 g/dL (ref 3.5–5.0)
ALT: 18 U/L (ref 14–54)
AST: 22 U/L (ref 15–41)
Alkaline Phosphatase: 68 U/L (ref 38–126)
BILIRUBIN TOTAL: 0.4 mg/dL (ref 0.3–1.2)
Bilirubin, Direct: <0.1 mg/dL — ABNORMAL LOW (ref 0.1–0.5)
TOTAL PROTEIN: 7 g/dL (ref 6.5–8.1)

## 2016-12-26 LAB — TROPONIN I
Troponin I: <0.03 ng/mL (ref <0.03)
Troponin I: <0.03 ng/mL (ref <0.03)

## 2016-12-26 LAB — LIPASE, BLOOD: LIPASE: 18 U/L (ref 11–51)

## 2016-12-26 LAB — TSH: TSH: 0.876 u[IU]/mL (ref 0.350–4.500)

## 2016-12-26 MED ORDER — ENOXAPARIN SODIUM 100 MG/ML ~~LOC~~ SOLN
1.0000 mg/kg | Freq: Once | SUBCUTANEOUS | Status: AC
  Administered 2016-12-26: 90 mg via SUBCUTANEOUS
  Filled 2016-12-26: qty 1

## 2016-12-26 MED ORDER — ENOXAPARIN SODIUM 100 MG/ML ~~LOC~~ SOLN
1.0000 mg/kg | Freq: Two times a day (BID) | SUBCUTANEOUS | Status: DC
  Administered 2016-12-27: 90 mg via SUBCUTANEOUS
  Filled 2016-12-26: qty 1

## 2016-12-26 MED ORDER — SODIUM CHLORIDE 0.9 % IV SOLN
INTRAVENOUS | Status: DC
  Administered 2016-12-26 – 2016-12-27 (×2): via INTRAVENOUS

## 2016-12-26 MED ORDER — NICOTINE 21 MG/24HR TD PT24
21.0000 mg | MEDICATED_PATCH | Freq: Every day | TRANSDERMAL | Status: DC
  Administered 2016-12-26 – 2016-12-27 (×2): 21 mg via TRANSDERMAL
  Filled 2016-12-26 (×2): qty 1

## 2016-12-26 MED ORDER — ASPIRIN EC 81 MG PO TBEC
81.0000 mg | DELAYED_RELEASE_TABLET | Freq: Every day | ORAL | Status: DC
  Administered 2016-12-27: 81 mg via ORAL
  Filled 2016-12-26: qty 1

## 2016-12-26 MED ORDER — ZOLPIDEM TARTRATE 5 MG PO TABS
5.0000 mg | ORAL_TABLET | Freq: Every evening | ORAL | Status: DC | PRN
  Administered 2016-12-26: 5 mg via ORAL
  Filled 2016-12-26: qty 1

## 2016-12-26 MED ORDER — IOPAMIDOL (ISOVUE-370) INJECTION 76%
100.0000 mL | Freq: Once | INTRAVENOUS | Status: AC | PRN
  Administered 2016-12-26: 100 mL via INTRAVENOUS

## 2016-12-26 MED ORDER — VENLAFAXINE HCL ER 75 MG PO CP24
75.0000 mg | ORAL_CAPSULE | Freq: Every day | ORAL | Status: DC
  Administered 2016-12-26 – 2016-12-27 (×2): 75 mg via ORAL
  Filled 2016-12-26 (×2): qty 1

## 2016-12-26 MED ORDER — OXYCODONE HCL 5 MG PO TABS: 5.0000 mg | ORAL_TABLET | Freq: Four times a day (QID) | ORAL | Status: DC | PRN

## 2016-12-26 MED ORDER — NITROGLYCERIN 0.4 MG SL SUBL
0.4000 mg | SUBLINGUAL_TABLET | SUBLINGUAL | Status: DC | PRN
  Administered 2016-12-26 (×2): 0.4 mg via SUBLINGUAL
  Filled 2016-12-26: qty 1

## 2016-12-26 MED ORDER — OXYCODONE-ACETAMINOPHEN 10-325 MG PO TABS: 1.0000 | ORAL_TABLET | Freq: Four times a day (QID) | ORAL | Status: DC | PRN

## 2016-12-26 MED ORDER — ONDANSETRON HCL 4 MG PO TABS: 4.0000 mg | ORAL_TABLET | Freq: Four times a day (QID) | ORAL | Status: DC | PRN

## 2016-12-26 MED ORDER — OXYCODONE-ACETAMINOPHEN 5-325 MG PO TABS
1.0000 | ORAL_TABLET | Freq: Four times a day (QID) | ORAL | Status: DC | PRN
  Administered 2016-12-26 – 2016-12-27 (×2): 1 via ORAL
  Filled 2016-12-26 (×2): qty 1

## 2016-12-26 MED ORDER — METOPROLOL SUCCINATE 12.5 MG HALF TABLET
12.5000 mg | ORAL_TABLET | Freq: Every day | ORAL | Status: DC
  Administered 2016-12-26 – 2016-12-27 (×2): 12.5 mg via ORAL
  Filled 2016-12-26 (×2): qty 1

## 2016-12-26 MED ORDER — SODIUM CHLORIDE 0.9 % IV BOLUS (SEPSIS)
500.0000 mL | Freq: Once | INTRAVENOUS | Status: AC
  Administered 2016-12-26: 500 mL via INTRAVENOUS

## 2016-12-26 MED ORDER — SODIUM CHLORIDE 0.9% FLUSH
3.0000 mL | Freq: Two times a day (BID) | INTRAVENOUS | Status: DC
  Administered 2016-12-26 – 2016-12-27 (×2): 3 mL via INTRAVENOUS

## 2016-12-26 MED ORDER — TIZANIDINE HCL 4 MG PO TABS: 4.0000 mg | ORAL_TABLET | Freq: Four times a day (QID) | ORAL | Status: DC | PRN

## 2016-12-26 MED ORDER — ONDANSETRON HCL 4 MG/2ML IJ SOLN: 4.0000 mg | Freq: Four times a day (QID) | INTRAMUSCULAR | Status: DC | PRN

## 2016-12-26 NOTE — Progress Notes (Signed)
ANTICOAGULATION CONSULT NOTE - Initial Consult  Pharmacy Consult for enoxaparin treatment dose Indication: chest pain/ACS  Allergies  Allergen Reactions  . Ibuprofen Itching  . Tylenol [Acetaminophen] Nausea Only    Patient Measurements: Height: 5\' 6"  (167.6 cm) Weight: 195 lb (88.5 kg) IBW/kg (Calculated) : 59.3  Vital Signs: Temp: 97.3 F (36.3 C) (03/27 1335) Temp Source: Oral (03/27 1335) BP: 115/63 (03/27 1730) Pulse Rate: 78 (03/27 1730)  Labs:  Recent Labs  12/26/16 1358 12/26/16 1836  HGB 14.4  --   HCT 43.3  --   PLT 343  --   CREATININE 1.10*  --   TROPONINI <0.03 <0.03    Estimated Creatinine Clearance: 72.4 mL/min (A) (by C-G formula based on SCr of 1.1 mg/dL (H)).   Medical History: Past Medical History:  Diagnosis Date  . Depression   . GERD (gastroesophageal reflux disease)   . Hyperlipidemia   . Hypertension   . Multilevel degenerative disc disease    Assessment: Pharmacy consulted to dose enoxaparin for ACS/NSTEMI in this 46 year old female presenting with chest pain. Patient was not on anticoagulants prior to admission and baseline labs were obtained.   Goal of Therapy:  Monitor platelets by anticoagulation protocol: Yes   Plan:  Enoxaparin 1 mg/kg subcutaneously q12h CBC and SCr to be checked at least every 72 hours per protocol  Cindi CarbonMary M Artie Takayama, PharmD, BCPS Clinical Pharmacist 12/26/2016,7:38 PM

## 2016-12-26 NOTE — ED Notes (Signed)
Patient transported to CT 

## 2016-12-26 NOTE — ED Triage Notes (Signed)
PAtient arrived by EMS from home for c/o chest pain that woke her up from her sleep during her nap. Pain is located in central chest and left chest that radiates down L arm. Patient reports feeling lightheaded. A&O x4. HX HTN EMS vitals 154/100 and gave 324 aspirin in route to hospital

## 2016-12-26 NOTE — ED Provider Notes (Signed)
[facility] Emergency Department Provider Note  ____________________________________________  Time seen: Approximately 2:25 PM  I have reviewed the triage vital signs and the nursing notes.   HISTORY  Chief Complaint Chest Pain   HPI Meredith Mckee is a 46 y.o. female history of hypertension, hyperlipidemia, and GERD who presents for evaluation of chest pain. Patient reports that the pain woke her up from her sleep, sudden severe stabbing pain located in the center of her chest, radiating to her back and to her left upper extremity. Has been present for a few hours. This pleuritic in nature. Patient denies ever having similar pain. She has strong family history of ischemic heart disease in both father and brother at early ages. She is a smoker. She denies cough, congestion, or URI symptoms. She denies shortness of breath, nausea or vomiting associated with this pain but has been feeling dizzy. She denies paresthesias of her extremities.She reports that the pain is currently 10 out of 10.  Past Medical History:  Diagnosis Date  . Depression   . GERD (gastroesophageal reflux disease)   . Hyperlipidemia   . Hypertension   . Multilevel degenerative disc disease     Patient Active Problem List   Diagnosis Date Noted  . Ear pain, bilateral 11/12/2016  . Migraine 08/30/2016  . Hot flashes 08/30/2016  . Irregular heartbeat 07/14/2016  . Prediabetes 07/14/2016  . Chronic low back pain 05/12/2016  . Benign essential HTN 03/31/2016  . Hyperlipidemia 03/31/2016  . Obesity (BMI 30.0-34.9) 03/31/2016  . Insomnia 03/31/2016    Past Surgical History:  Procedure Laterality Date  . ABDOMINAL HYSTERECTOMY    . CHOLECYSTECTOMY    . ELBOW SURGERY Right    Ulnar nerve release  . JOINT REPLACEMENT     Great toes  . OOPHORECTOMY    . OTHER SURGICAL HISTORY     Reports surgery on R Femur for cyst; states rod was placed.  Marland Kitchen. ROTATOR CUFF REPAIR Left     Prior  to Admission medications   Medication Sig Start Date End Date Taking? Authorizing Provider  metoprolol succinate (TOPROL-XL) 25 MG 24 hr tablet Take 1 tablet (25 mg total) by mouth daily. 10/24/16  Yes Tommie SamsJayce G Cook, DO  oxyCODONE-acetaminophen (PERCOCET) 10-325 MG tablet Take 1 tablet by mouth every 6 (six) hours as needed for pain. 05/12/16  Yes Tommie SamsJayce G Cook, DO  tiZANidine (ZANAFLEX) 4 MG tablet Take 1 tablet (4 mg total) by mouth every 6 (six) hours as needed for muscle spasms. 03/31/16  Yes Tommie SamsJayce G Cook, DO  venlafaxine XR (EFFEXOR-XR) 75 MG 24 hr capsule Take 1 capsule (75 mg total) by mouth daily. 10/24/16  Yes Tommie SamsJayce G Cook, DO  zolpidem (AMBIEN) 10 MG tablet Take 1 tablet (10 mg total) by mouth at bedtime as needed for sleep. 11/10/16  Yes Jayce G Cook, DO  NEOMYCIN-POLYMYXIN-HYDROCORTISONE (CORTISPORIN) 1 % SOLN otic solution Place 4 drops into both ears every 6 (six) hours. Patient not taking: Reported on 12/26/2016 11/10/16   Tommie SamsJayce G Cook, DO  SUMAtriptan (IMITREX) 50 MG tablet 1 tablet once. May repeat in 2 hours if headache persists or recurs. 08/30/16   Tommie SamsJayce G Cook, DO    Allergies Ibuprofen and Tylenol [acetaminophen]  Family History  Problem Relation Age of Onset  . Lung cancer Mother   . Alzheimer's disease Mother   . Diabetes Father   . Lung cancer Father   . Heart disease Father   . Diabetes Maternal Grandmother  Social History Social History  Substance Use Topics  . Smoking status: Current Every Day Smoker    Packs/day: 1.00    Years: 32.00    Types: Cigarettes  . Smokeless tobacco: Never Used  . Alcohol use No    Review of Systems  Constitutional: Negative for fever. + Lightheadedness Eyes: Negative for visual changes. ENT: Negative for sore throat. Neck: No neck pain  Cardiovascular: + chest pain. Respiratory: Negative for shortness of breath. Gastrointestinal: Negative for abdominal pain, vomiting or diarrhea. Genitourinary: Negative for  dysuria. Musculoskeletal: Negative for back pain. Skin: Negative for rash. Neurological: Negative for headaches, weakness or numbness. Psych: No SI or HI  ____________________________________________   PHYSICAL EXAM:  VITAL SIGNS: ED Triage Vitals  Enc Vitals Group     BP 12/26/16 1335 (!) 137/104     Pulse Rate 12/26/16 1335 75     Resp 12/26/16 1335 (!) 24     Temp 12/26/16 1335 97.3 F (36.3 C)     Temp Source 12/26/16 1335 Oral     SpO2 12/26/16 1335 97 %     Weight 12/26/16 1336 195 lb (88.5 kg)     Height 12/26/16 1336 5\' 6"  (1.676 m)     Head Circumference --      Peak Flow --      Pain Score 12/26/16 1335 10     Pain Loc --      Pain Edu? --      Excl. in GC? --     Constitutional: Alert and oriented. Well appearing and in no apparent distress. HEENT:      Head: Normocephalic and atraumatic.         Eyes: Conjunctivae are normal. Sclera is non-icteric. EOMI. PERRL      Mouth/Throat: Mucous membranes are moist.       Neck: Supple with no signs of meningismus. Cardiovascular: Regular rate and rhythm. No murmurs, gallops, or rubs. 2+ symmetrical distal pulses are present in all extremities. No JVD. Respiratory: Normal respiratory effort. Lungs are clear to auscultation bilaterally. No wheezes, crackles, or rhonchi.  Gastrointestinal: Soft, non tender, and non distended with positive bowel sounds. No rebound or guarding. Genitourinary: No CVA tenderness. Musculoskeletal: Nontender with normal range of motion in all extremities. No edema, cyanosis, or erythema of extremities. Neurologic: Normal speech and language. Face is symmetric. Moving all extremities. No gross focal neurologic deficits are appreciated. Skin: Skin is warm, dry and intact. No rash noted. Psychiatric: Mood and affect are normal. Speech and behavior are normal.  ____________________________________________   LABS (all labs ordered are listed, but only abnormal results are displayed)  Labs  Reviewed  BASIC METABOLIC PANEL - Abnormal; Notable for the following:       Result Value   Creatinine, Ser 1.10 (*)    GFR calc non Af Amer 60 (*)    All other components within normal limits  HEPATIC FUNCTION PANEL - Abnormal; Notable for the following:    Bilirubin, Direct <0.1 (*)    All other components within normal limits  CBC  TROPONIN I  LIPASE, BLOOD   ____________________________________________  EKG  ED ECG REPORT I, Nita Sickle, the attending physician, personally viewed and interpreted this ECG.  Normal sinus rhythm, rate of 70, normal intervals, normal axis, no ST elevations or depressions, T-wave inversions in inferior and lateral leads which are unchanged from prior.   15:18 - normal sinus rhythm, rate of 61, normal intervals, normal axis, no ST elevations or depressions, T-wave  inversion in inferior and lateral leads unchanged from prior.  ____________________________________________  RADIOLOGY  CXR: Negative  CTA dissection: Negative ____________________________________________   PROCEDURES  Procedure(s) performed: None Procedures Critical Care performed:  None ____________________________________________   INITIAL IMPRESSION / ASSESSMENT AND PLAN / ED COURSE   46 y.o. female history of hypertension, hyperlipidemia, and GERD who presents for evaluation of sudden onset severe stabbing central chest pain radiating to her back and left arm associated with lightheadedness. EKG with no ischemic changes making this presentation concerning for dissection versus PE versus ACS versus GERD. Patient has strong equal pulses in all 4 extremities and a chest x-ray with no widened mediastinum We'll send patient for CT angiogram of her chest and abdomen. Aspirin given by EMS. Clinical Course as of Dec 26 1698  Tue Dec 26, 2016  1516 CT with no evidence of dissection. Patient reports that pain is improved but still mild discomfort. Will give nitroglycerin and  repeat EKG.  [CV]  1657 Chest pain fully resolved after 2 sublingual nitros. Troponin 1 is negative. Due to significant risk factors and the fact that the pain improved with nitroglycerin will admit patient to the hospitalist for cardiac workup.  [CV]    Clinical Course User Index [CV] Nita Sickle, MD    Pertinent labs & imaging results that were available during my care of the patient were reviewed by me and considered in my medical decision making (see chart for details).    ____________________________________________   FINAL CLINICAL IMPRESSION(S) / ED DIAGNOSES  Final diagnoses:  Chest pain, unspecified type      NEW MEDICATIONS STARTED DURING THIS VISIT:  New Prescriptions   No medications on file     Note:  This document was prepared using Dragon voice recognition software and may include unintentional dictation errors.    Nita Sickle, MD 12/26/16 1700

## 2016-12-26 NOTE — H&P (Signed)
[facility] - [place] at [facility]   PATIENT NAME: Meredith Mckee    MR#:  [id number]  DATE OF BIRTH:  21-Nov-1970  DATE OF ADMISSION:  12/26/2016  PRIMARY CARE PHYSICIAN: Tommie Sams, DO   REQUESTING/REFERRING PHYSICIAN:   CHIEF COMPLAINT:   Chief Complaint  Patient presents with  . Chest Pain    HISTORY OF PRESENT ILLNESS: Meredith Mckee  is a 46 y.o. female with a known history of Depression, gastric esophageal reflux disease, hyperlipidemia, hypertension, family history of early coronary disease in the 30s, who presents to the hospital with complaints of severe chest pain. Continue the patient, she was sleeping, and suddenly woke up with severe midsternal chest pain. Pain was described as sharp, stabbing, intermittent pain with a significant changes with activity or position, it was radiating to left arm, improving whenever she would lay down on the left arm area and would be slowly, it is accompanied by shortness of breath, feeling presyncopal, having irregular heart beats. No other radiation of the pain. On arrival to emergency room, patient was given opiates, improved. She underwent CT angiogram of the chest, no PE was noted or dissection. Hospitalist services were contacted for admission. EKG was remarkable for nonspecific ST-T changes. In the emergency room, patient's heart rate was reported ranging between 30s to 130s, but no atrial fibrillation was noted.     MEDICAL HISTORY:   Past Medical History:  Diagnosis Date  . Depression   . GERD (gastroesophageal reflux disease)   . Hyperlipidemia   . Hypertension   . Multilevel degenerative disc disease     PAST SURGICAL HISTORY: Past Surgical History:  Procedure Laterality Date  . ABDOMINAL HYSTERECTOMY    . CHOLECYSTECTOMY    . ELBOW SURGERY Right    Ulnar nerve release  . JOINT REPLACEMENT     Great toes  . OOPHORECTOMY    . OTHER SURGICAL HISTORY     Reports surgery on R Femur for  cyst; states rod was placed.  Marland Kitchen ROTATOR CUFF REPAIR Left     SOCIAL HISTORY:  Social History  Substance Use Topics  . Smoking status: Current Every Day Smoker    Packs/day: 1.00    Years: 32.00    Types: Cigarettes  . Smokeless tobacco: Never Used  . Alcohol use No    FAMILY HISTORY:  Family History  Problem Relation Age of Onset  . Lung cancer Mother   . Alzheimer's disease Mother   . Diabetes Father   . Lung cancer Father   . Heart disease Father   . Diabetes Maternal Grandmother     DRUG ALLERGIES:  Allergies  Allergen Reactions  . Ibuprofen Itching  . Tylenol [Acetaminophen] Nausea Only    Review of Systems  Constitutional: Negative for chills, fever and weight loss.  HENT: Negative for congestion.   Eyes: Negative for blurred vision and double vision.  Respiratory: Positive for shortness of breath. Negative for cough, sputum production and wheezing.   Cardiovascular: Positive for chest pain and palpitations. Negative for orthopnea, leg swelling and PND.  Gastrointestinal: Negative for abdominal pain, blood in stool, constipation, diarrhea, nausea and vomiting.  Genitourinary: Negative for dysuria, frequency, hematuria and urgency.  Musculoskeletal: Negative for falls.  Neurological: Negative for dizziness, tremors, focal weakness and headaches.  Endo/Heme/Allergies: Does not bruise/bleed easily.  Psychiatric/Behavioral: Negative for depression. The patient does not have insomnia.     MEDICATIONS AT HOME:  Prior to Admission medications   Medication Sig  Start Date End Date Taking? Authorizing Provider  metoprolol succinate (TOPROL-XL) 25 MG 24 hr tablet Take 1 tablet (25 mg total) by mouth daily. 10/24/16  Yes Tommie Sams, DO  oxyCODONE-acetaminophen (PERCOCET) 10-325 MG tablet Take 1 tablet by mouth every 6 (six) hours as needed for pain. 05/12/16  Yes Tommie Sams, DO  tiZANidine (ZANAFLEX) 4 MG tablet Take 1 tablet (4 mg total) by mouth every 6 (six) hours as  needed for muscle spasms. 03/31/16  Yes Tommie Sams, DO  venlafaxine XR (EFFEXOR-XR) 75 MG 24 hr capsule Take 1 capsule (75 mg total) by mouth daily. 10/24/16  Yes Tommie Sams, DO  zolpidem (AMBIEN) 10 MG tablet Take 1 tablet (10 mg total) by mouth at bedtime as needed for sleep. 11/10/16  Yes Jayce G Cook, DO  NEOMYCIN-POLYMYXIN-HYDROCORTISONE (CORTISPORIN) 1 % SOLN otic solution Place 4 drops into both ears every 6 (six) hours. Patient not taking: Reported on 12/26/2016 11/10/16   Tommie Sams, DO  SUMAtriptan (IMITREX) 50 MG tablet 1 tablet once. May repeat in 2 hours if headache persists or recurs. 08/30/16   Tommie Sams, DO      PHYSICAL EXAMINATION:   VITAL SIGNS: Blood pressure 115/79, pulse (!) 121, temperature 97.3 F (36.3 C), temperature source Oral, resp. rate 17, height 5\' 6"  (1.676 m), weight 88.5 kg (195 lb), SpO2 97 %.  GENERAL:  46 y.o.-year-old patient lying in the bed lying in the bed with no acute distress.  EYES: Pupils equal, round, reactive to light and accommodation. No scleral icterus. Extraocular muscles intact.  HEENT: Head atraumatic, normocephalic. Oropharynx and nasopharynx clear.  NECK:  Supple, no jugular venous distention. No thyroid enlargement, no tenderness.  LUNGS: Normal breath sounds bilaterally, no wheezing, rales,rhonchi or crepitation. No use of accessory muscles of respiration.  CARDIOVASCULAR: S1, S2 normal. No murmurs, rubs, or gallops.  ABDOMEN: Soft, nontender, nondistended. Bowel sounds present. No organomegaly or mass.  EXTREMITIES: No pedal edema, cyanosis, or clubbing.  NEUROLOGIC: Cranial nerves II through XII are intact. Muscle strength 5/5 in all extremities. Sensation intact. Gait not checked.  PSYCHIATRIC: The patient is alert and oriented x 3.  SKIN: No obvious rash, lesion, or ulcer.   LABORATORY PANEL:   CBC  Recent Labs Lab 12/26/16 1358  WBC 7.2  HGB 14.4  HCT 43.3  PLT 343  MCV 86.1  MCH 28.6  MCHC 33.2  RDW 13.0    ------------------------------------------------------------------------------------------------------------------  Chemistries   Recent Labs Lab 12/26/16 1358  NA 139  K 4.1  CL 108  CO2 25  GLUCOSE 92  BUN 8  CREATININE 1.10*  CALCIUM 9.4  AST 22  ALT 18  ALKPHOS 68  BILITOT 0.4   ------------------------------------------------------------------------------------------------------------------  Cardiac Enzymes  Recent Labs Lab 12/26/16 1358  TROPONINI <0.03   ------------------------------------------------------------------------------------------------------------------  RADIOLOGY: Dg Chest 2 View  Result Date: 12/26/2016 CLINICAL DATA:  Onset of chest pain today after a nap. EXAM: CHEST  2 VIEW COMPARISON:  PA and lateral chest 04/28/2016. FINDINGS: The lungs are clear. Heart size is normal. No pneumothorax or pleural effusion. No acute bony abnormality. IMPRESSION: Negative chest. Electronically Signed   By: Drusilla Kanner M.D.   On: 12/26/2016 14:02   Ct Angio Chest/abd/pel For Dissection W And/or Wo Contrast  Result Date: 12/26/2016 CLINICAL DATA:  Chest pain radiating into the left arm EXAM: CT ANGIOGRAPHY CHEST, ABDOMEN AND PELVIS TECHNIQUE: Multidetector CT imaging through the chest, abdomen and pelvis was performed using the standard protocol during bolus administration  of intravenous contrast. Multiplanar reconstructed images and MIPs were obtained and reviewed to evaluate the vascular anatomy. CONTRAST:  100 mL Isovue 370. COMPARISON:  None. FINDINGS: CTA CHEST FINDINGS Cardiovascular: Thoracic aorta is well visualize without aneurysmal dilatation or dissection. The pulmonary artery shows no large central pulmonary embolus. No significant coronary calcifications are noted. No cardiac enlargement is seen. Mediastinum/Nodes: The thoracic inlet is within normal limits. No hilar or mediastinal adenopathy is seen. The esophagus as visualized is within normal  limits. Lungs/Pleura: Lungs are clear. No pleural effusion or pneumothorax. Musculoskeletal: No chest wall abnormality. No acute or significant osseous findings. Review of the MIP images confirms the above findings. CTA ABDOMEN AND PELVIS FINDINGS VASCULAR Aorta: Mild atherosclerotic changes of the aorta are noted without aneurysmal dilatation or dissection. Celiac: Patent without evidence of aneurysm, dissection, vasculitis or significant stenosis. SMA: Patent without evidence of aneurysm, dissection, vasculitis or significant stenosis. Renals: Both renal arteries are patent without evidence of aneurysm, dissection, vasculitis, fibromuscular dysplasia or significant stenosis. IMA: Patent without evidence of aneurysm, dissection, vasculitis or significant stenosis. Iliacs: Iliacs are patent with mild atherosclerotic calcification. No aneurysmal dilatation is noted. Veins: Within normal limits. Review of the MIP images confirms the above findings. NON-VASCULAR Hepatobiliary: Fatty infiltration of the liver is noted. The gallbladder has been surgically removed. Pancreas: Unremarkable. No pancreatic ductal dilatation or surrounding inflammatory changes. Spleen: Normal in size without focal abnormality. Adrenals/Urinary Tract: Adrenal glands are unremarkable. Kidneys are normal, without renal calculi, focal lesion, or hydronephrosis. Bladder is unremarkable. Stomach/Bowel: Stomach is within normal limits. Appendix appears normal. No evidence of bowel wall thickening, distention, or inflammatory changes. Lymphatic: No significant lymphadenopathy is noted. Reproductive: Status post hysterectomy. No adnexal masses. Other: No abdominal wall hernia or abnormality. No abdominopelvic ascites. Musculoskeletal: No acute or significant osseous findings. Changes of right femoral rod placement are seen. Review of the MIP images confirms the above findings. IMPRESSION: No evidence of aneurysmal dilatation or dissection. No acute  abnormality is noted.  Chronic changes as described above. Electronically Signed   By: Alcide CleverMark  Lukens M.D.   On: 12/26/2016 15:10    EKG: Orders placed or performed during the hospital encounter of 12/26/16  . ED EKG within 10 minutes  . ED EKG within 10 minutes  . EKG 12-Lead  . EKG 12-Lead  . ED EKG  . ED EKG  . EKG 12-Lead  . EKG 12-Lead  EKG in the emergency room revealed sinus rhythm with atrial premature complexes, borderline T abnormalities in anterior leads, inferior leads   IMPRESSION AND PLAN:  Active Problems:   Chest pain   Palpitations   Acute renal insufficiency   Essential hypertension   Tobacco abuse counseling #1 chest pain, concerning for ischemic, admit patient to medical floor, continue Toprol-XL, aspirin, nitroglycerin topically, Lovenox subcutaneously, follow cardiac enzymes 3, get Myoview stress test in the morning, if negative #2. Palpitations, possibly related to SSRIs/opiate combination, get magnesium level checked, continue Toprol-XL, following heart rate closely, heart rate is reported in the 30s to 40s in emergency room , check TSH  . #3. Acute renal insufficiency, continue IV fluids, check creatinine and the morning   #4. Essential hypertension, well controlled, continue outpatient medications #5. Tobacco abuse. Counseling, discussed this patient for 4 minutes, nicotine replacement therapy will be initiated, she is agreeable  All the records are reviewed and case discussed with ED provider. Management plans discussed with the patient, family and they are in agreement.  CODE STATUS: Code Status History  This patient does not have a recorded code status. Please follow your organizational policy for patients in this situation.       TOTAL TIME TAKING CARE OF THIS PATIEN72minutes.    Katharina Caper M.D on 12/26/2016 at 5:20 PM  Between 7am to 6pm - Pager - [phone] After 6pm go to [email or web address] - password [email or web address] [facility]  [facility] [place]  [facility]ospitalists  Office  [phone]  CC: Primary care physician; Tommie Sams, DO

## 2016-12-27 ENCOUNTER — Observation Stay: Payer: Medicare Other

## 2016-12-27 ENCOUNTER — Observation Stay (HOSPITAL_BASED_OUTPATIENT_CLINIC_OR_DEPARTMENT_OTHER): Payer: Medicare Other

## 2016-12-27 DIAGNOSIS — R079 Chest pain, unspecified: Secondary | ICD-10-CM | POA: Diagnosis not present

## 2016-12-27 DIAGNOSIS — I10 Essential (primary) hypertension: Secondary | ICD-10-CM | POA: Diagnosis not present

## 2016-12-27 DIAGNOSIS — R002 Palpitations: Secondary | ICD-10-CM | POA: Diagnosis not present

## 2016-12-27 DIAGNOSIS — R0789 Other chest pain: Secondary | ICD-10-CM | POA: Diagnosis not present

## 2016-12-27 DIAGNOSIS — K2970 Gastritis, unspecified, without bleeding: Secondary | ICD-10-CM | POA: Diagnosis not present

## 2016-12-27 DIAGNOSIS — N289 Disorder of kidney and ureter, unspecified: Secondary | ICD-10-CM | POA: Diagnosis not present

## 2016-12-27 LAB — NM MYOCAR MULTI W/SPECT W/WALL MOTION / EF
CHL CUP RESTING HR STRESS: 63 {beats}/min
LV sys vol: 48 mL
LVDIAVOL: 110 mL (ref 46–106)
NUC STRESS TID: 1.06
Peak HR: 112 {beats}/min
Percent HR: 64 %
SDS: 1
SRS: 2
SSS: 3

## 2016-12-27 LAB — BASIC METABOLIC PANEL
Anion gap: 5 (ref 5–15)
BUN: 9 mg/dL (ref 6–20)
CALCIUM: 9.1 mg/dL (ref 8.9–10.3)
CO2: 26 mmol/L (ref 22–32)
CREATININE: 0.74 mg/dL (ref 0.44–1.00)
Chloride: 109 mmol/L (ref 101–111)
Glucose, Bld: 97 mg/dL (ref 65–99)
Potassium: 4.1 mmol/L (ref 3.5–5.1)
SODIUM: 140 mmol/L (ref 135–145)

## 2016-12-27 LAB — CBC
HCT: 41.3 % (ref 35.0–47.0)
Hemoglobin: 14 g/dL (ref 12.0–16.0)
MCH: 29.1 pg (ref 26.0–34.0)
MCHC: 33.8 g/dL (ref 32.0–36.0)
MCV: 85.9 fL (ref 80.0–100.0)
PLATELETS: 294 10*3/uL (ref 150–440)
RBC: 4.81 MIL/uL (ref 3.80–5.20)
RDW: 13 % (ref 11.5–14.5)
WBC: 5.3 10*3/uL (ref 3.6–11.0)

## 2016-12-27 LAB — TROPONIN I: Troponin I: <0.03 ng/mL (ref <0.03)

## 2016-12-27 MED ORDER — REGADENOSON 0.4 MG/5ML IV SOLN
0.4000 mg | Freq: Once | INTRAVENOUS | Status: AC
  Administered 2016-12-27: 0.4 mg via INTRAVENOUS

## 2016-12-27 MED ORDER — TECHNETIUM TC 99M TETROFOSMIN IV KIT
13.2800 | PACK | Freq: Once | INTRAVENOUS | Status: AC | PRN
Start: 2016-12-27 — End: 2016-12-27
  Administered 2016-12-27: 13.28 via INTRAVENOUS

## 2016-12-27 MED ORDER — PANTOPRAZOLE SODIUM 40 MG PO TBEC: 40.0000 mg | DELAYED_RELEASE_TABLET | Freq: Two times a day (BID) | ORAL | 2 refills | Status: AC

## 2016-12-27 MED ORDER — TECHNETIUM TC 99M TETROFOSMIN IV KIT
31.0000 | PACK | Freq: Once | INTRAVENOUS | Status: AC | PRN
  Administered 2016-12-27: 31 via INTRAVENOUS

## 2016-12-27 NOTE — Plan of Care (Signed)
Problem: Safety: Goal: Ability to remain free from injury will improve Outcome: Progressing Patient has refused to have bed alarm activated, patient is a high fall risk

## 2016-12-27 NOTE — Care Management Obs Status (Signed)
MEDICARE OBSERVATION STATUS NOTIFICATION   Patient Details  Name: Meredith Mckee MRN: [id number] Date of Birth: Mar 24, 1971   Medicare Observation Status Notification Given:  Yes    Eber HongGreene, Anila Bojarski R, RN 12/27/2016, 3:32 PM

## 2016-12-27 NOTE — Progress Notes (Signed)
Patient discharged home as ordered,instructions explained and well understood,follow up appointment made,vital signs within normal limits,escorted by staff and family member via wheel chair

## 2016-12-27 NOTE — Progress Notes (Signed)
   Stress test ordered per IM and ran this morning. Consult for chest pain noted. Will await results of stress test. If normal, no further inpatient cardiac workup is advised. If abnormal will see to discuss possible LHC. No further recommendations at this time.

## 2016-12-28 LAB — HEMOGLOBIN A1C
Hgb A1c MFr Bld: 5.9 % — ABNORMAL HIGH (ref 4.8–5.6)
Mean Plasma Glucose: 123 mg/dL

## 2016-12-28 LAB — HIV ANTIBODY (ROUTINE TESTING W REFLEX): HIV SCREEN 4TH GENERATION: NONREACTIVE

## 2016-12-28 NOTE — Discharge Summary (Signed)
[facility] - [place] at [facility]   PATIENT NAME: Meredith Mckee    MR#:  [id number]  DATE OF BIRTH:  Aug 17, 1971  DATE OF ADMISSION:  12/26/2016   ADMITTING PHYSICIAN: Katharina Caper, MD  DATE OF DISCHARGE: 12/27/2016  5:06 PM  PRIMARY CARE PHYSICIAN: Tommie Sams, DO   ADMISSION DIAGNOSIS:   Chest pain [R07.9] Chest pain, unspecified type [R07.9]  DISCHARGE DIAGNOSIS:   Active Problems:   Chest pain   Palpitations   Acute renal insufficiency   Essential hypertension   Tobacco abuse counseling   SECONDARY DIAGNOSIS:   Past Medical History:  Diagnosis Date  . Depression   . GERD (gastroesophageal reflux disease)   . Hyperlipidemia   . Hypertension   . Multilevel degenerative disc disease     HOSPITAL COURSE:   46 year old female with past medical history significant for gastroesophageal reflux disease, depression and anxiety, hypertension, degenerative disc disease with family history of coronary artery disease presents to hospital secondary to chest pain and epigastric pain.  #1 chest pain-secondary to gastroesophageal reflux disease. -Due to family history, patient had a stress test which was negative and was a low risk scan. -Troponins remain negative in the hospital. She was on PPI in the past and hasn't been taking it currently. -Will be discharged on twice a day PPI. Outpatient GI follow-up recommended.  #2 hypertension-continue metoprolol  #3 migraine headaches-on sumatriptan.  #4 depression-on Effexor.  Stable otherwise and will be discharged home today.  DISCHARGE CONDITIONS:   Stable  CONSULTS OBTAINED:   None  DRUG ALLERGIES:   Allergies  Allergen Reactions  . Ibuprofen Itching  . Tylenol [Acetaminophen] Nausea Only   DISCHARGE MEDICATIONS:   Allergies as of 12/27/2016      Reactions   Ibuprofen Itching   Tylenol [acetaminophen] Nausea Only      Medication List    STOP taking these medications     NEOMYCIN-POLYMYXIN-HYDROCORTISONE 1 % Soln otic solution Commonly known as:  CORTISPORIN   oxyCODONE-acetaminophen 10-325 MG tablet Commonly known as:  PERCOCET     TAKE these medications   metoprolol succinate 25 MG 24 hr tablet Commonly known as:  TOPROL-XL Take 1 tablet (25 mg total) by mouth daily.   pantoprazole 40 MG tablet Commonly known as:  PROTONIX Take 1 tablet (40 mg total) by mouth 2 (two) times daily.   SUMAtriptan 50 MG tablet Commonly known as:  IMITREX 1 tablet once. May repeat in 2 hours if headache persists or recurs.   tiZANidine 4 MG tablet Commonly known as:  ZANAFLEX Take 1 tablet (4 mg total) by mouth every 6 (six) hours as needed for muscle spasms.   venlafaxine XR 75 MG 24 hr capsule Commonly known as:  EFFEXOR-XR Take 1 capsule (75 mg total) by mouth daily.   zolpidem 10 MG tablet Commonly known as:  AMBIEN Take 1 tablet (10 mg total) by mouth at bedtime as needed for sleep.        DISCHARGE INSTRUCTIONS:   1. PCP follow-up in 1-2 weeks 2. GI follow up in 3-4 weeks  DIET:   Cardiac diet  ACTIVITY:   Activity as tolerated  OXYGEN:   Home Oxygen: No.  Oxygen Delivery: room air  DISCHARGE LOCATION:   home   If you experience worsening of your admission symptoms, develop shortness of breath, life threatening emergency, suicidal or homicidal thoughts you must seek medical attention immediately by calling 911 or calling your MD immediately  if symptoms  less severe.  You Must read complete instructions/literature along with all the possible adverse reactions/side effects for all the Medicines you take and that have been prescribed to you. Take any new Medicines after you have completely understood and accpet all the possible adverse reactions/side effects.   Please note  You were cared for by a hospitalist during your hospital stay. If you have any questions about your discharge medications or the care you received while you were  in the hospital after you are discharged, you can call the unit and asked to speak with the hospitalist on call if the hospitalist that took care of you is not available. Once you are discharged, your primary care physician will handle any further medical issues. Please note that NO REFILLS for any discharge medications will be authorized once you are discharged, as it is imperative that you return to your primary care physician (or establish a relationship with a primary care physician if you do not have one) for your aftercare needs so that they can reassess your need for medications and monitor your lab values.    On the day of Discharge:  VITAL SIGNS:   Blood pressure 128/77, pulse 68, temperature 97.9 F (36.6 C), temperature source Oral, resp. rate 16, height 5\' 6"  (1.676 m), weight 88.5 kg (195 lb), SpO2 99 %.  PHYSICAL EXAMINATION:    GENERAL:  46 y.o.-year-old patient lying in the bed with no acute distress.  EYES: Pupils equal, round, reactive to light and accommodation. No scleral icterus. Extraocular muscles intact.  HEENT: Head atraumatic, normocephalic. Oropharynx and nasopharynx clear.  NECK:  Supple, no jugular venous distention. No thyroid enlargement, no tenderness.  LUNGS: Normal breath sounds bilaterally, no wheezing, rales,rhonchi or crepitation. No use of accessory muscles of respiration.  CARDIOVASCULAR: S1, S2 normal. No murmurs, rubs, or gallops.  ABDOMEN: Soft, non-tender, non-distended. Bowel sounds present. No organomegaly or mass.  EXTREMITIES: No pedal edema, cyanosis, or clubbing.  NEUROLOGIC: Cranial nerves II through XII are intact. Muscle strength 5/5 in all extremities. Sensation intact. Gait not checked.  PSYCHIATRIC: The patient is alert and oriented x 3.  SKIN: No obvious rash, lesion, or ulcer.   DATA REVIEW:   CBC  Recent Labs Lab 12/27/16 0700  WBC 5.3  HGB 14.0  HCT 41.3  PLT 294    Chemistries   Recent Labs Lab 12/26/16 1358  12/26/16 1836 12/27/16 0700  NA 139  --  140  K 4.1  --  4.1  CL 108  --  109  CO2 25  --  26  GLUCOSE 92  --  97  BUN 8  --  9  CREATININE 1.10*  --  0.74  CALCIUM 9.4  --  9.1  MG  --  2.0  --   AST 22  --   --   ALT 18  --   --   ALKPHOS 68  --   --   BILITOT 0.4  --   --      Microbiology Results  No results found for this or any previous visit.  RADIOLOGY:  No results found.   Management plans discussed with the patient, family and they are in agreement.  CODE STATUS:  Code Status History    Date Active Date Inactive Code Status Order ID Comments User Context   12/26/2016  6:28 PM 12/27/2016  8:11 PM Full Code [id number]  Katharina Caper, MD ED      TOTAL TIME TAKING CARE OF THIS  PATIENT: 37 minutes.    Enid BaasKALISETTI,Janmichael Giraud M.D on 12/28/2016 at 3:42 PM  Between 7am to 6pm - Pager - [phone]  After 6pm go to [email or web address] - Social research officer, governmentpassword [email or web address]  [facility] [place] [facility]  Office  [phone]  CC: Primary care physician; Tommie SamsJayce G Cook, DO   Note: This dictation was prepared with Dragon dictation along with smaller phrase technology. Any transcriptional errors that result from this process are unintentional.

## 2017-01-04 ENCOUNTER — Ambulatory Visit (INDEPENDENT_AMBULATORY_CARE_PROVIDER_SITE_OTHER): Payer: Medicare Other | Admitting: Family Medicine

## 2017-01-04 ENCOUNTER — Encounter: Payer: Self-pay | Admitting: Family Medicine

## 2017-01-04 VITALS — BP 144/88 | HR 75 | Temp 98.1°F | Wt 195.5 lb

## 2017-01-04 DIAGNOSIS — G8929 Other chronic pain: Secondary | ICD-10-CM

## 2017-01-04 DIAGNOSIS — I10 Essential (primary) hypertension: Secondary | ICD-10-CM | POA: Diagnosis not present

## 2017-01-04 DIAGNOSIS — G894 Chronic pain syndrome: Secondary | ICD-10-CM | POA: Diagnosis not present

## 2017-01-04 DIAGNOSIS — M5137 Other intervertebral disc degeneration, lumbosacral region: Secondary | ICD-10-CM | POA: Diagnosis not present

## 2017-01-04 DIAGNOSIS — G43909 Migraine, unspecified, not intractable, without status migrainosus: Secondary | ICD-10-CM | POA: Diagnosis not present

## 2017-01-04 DIAGNOSIS — G4700 Insomnia, unspecified: Secondary | ICD-10-CM | POA: Diagnosis not present

## 2017-01-04 DIAGNOSIS — Z79899 Other long term (current) drug therapy: Secondary | ICD-10-CM | POA: Diagnosis not present

## 2017-01-04 DIAGNOSIS — M549 Dorsalgia, unspecified: Secondary | ICD-10-CM | POA: Diagnosis not present

## 2017-01-04 DIAGNOSIS — M545 Low back pain: Secondary | ICD-10-CM

## 2017-01-04 DIAGNOSIS — Z79891 Long term (current) use of opiate analgesic: Secondary | ICD-10-CM | POA: Diagnosis not present

## 2017-01-04 DIAGNOSIS — M25569 Pain in unspecified knee: Secondary | ICD-10-CM | POA: Diagnosis not present

## 2017-01-04 MED ORDER — ESZOPICLONE 1 MG PO TABS: 1.0000 mg | ORAL_TABLET | Freq: Every evening | ORAL | 0 refills | Status: AC | PRN

## 2017-01-04 MED ORDER — SUMATRIPTAN SUCCINATE 50 MG PO TABS: ORAL_TABLET | ORAL | 6 refills | Status: AC

## 2017-01-04 MED ORDER — METOPROLOL SUCCINATE ER 50 MG PO TB24: 50.0000 mg | ORAL_TABLET | Freq: Every day | ORAL | 1 refills | Status: AC

## 2017-01-04 NOTE — Assessment & Plan Note (Signed)
Refilled imitrex.

## 2017-01-04 NOTE — Assessment & Plan Note (Signed)
Established problem, worsening. Stop ambien. Trial of lunesta.

## 2017-01-04 NOTE — Assessment & Plan Note (Signed)
Worsening. Increasing metoprolol.

## 2017-01-04 NOTE — Patient Instructions (Signed)
Imitrex as needed.  I have increased the metoprolol.  Stop ambien. Try lunesta.  Take care  Dr. Adriana Simas

## 2017-01-04 NOTE — Progress Notes (Signed)
Subjective:  Patient ID: Meredith Mckee, female    DOB: 09/10/1971  Age: 46 y.o. MRN: [id number]  CC: Insomnia, HTN, Migraine  HPI:  46 year old female with chronic low back pain, hypertension, migraine presents with the above complaints.  Insomnia  Worsening.  Patient has been having difficulty as of late.  She states she's not had any restful sleep for the past 2 nights.  She has taken her Ambien without improvement.  She's been on Seroquel and trazodone as well as in the past.  Patient states that she's having difficulty falling and staying asleep.  She would like to discuss treatment options today.  Hypertension  Patient states that her blood pressures have been elevated as of late.  She endorses compliance with her metoprolol.  Migraine  Patient states she's been having frequent migraines as of late.  She is currently out of her Imitrex.  She has not informed me that she needed a refill.  Endorses compliance with metoprolol.  Social Hx   Social History   Social History  . Marital status: Single    Spouse name: N/A  . Number of children: N/A  . Years of education: N/A   Social History Main Topics  . Smoking status: Current Every Day Smoker    Packs/day: 1.00    Years: 32.00    Types: Cigarettes  . Smokeless tobacco: Never Used  . Alcohol use No  . Drug use: No  . Sexual activity: Not Asked   Other Topics Concern  . None   Social History Narrative  . None    Review of Systems  Neurological: Positive for headaches.  Psychiatric/Behavioral: Positive for sleep disturbance.   Objective:  BP (!) 144/88   Pulse 75   Temp 98.1 F (36.7 C) (Oral)   Wt 195 lb 8 oz (88.7 kg)   SpO2 96%   BMI 31.55 kg/m   BP/Weight 01/04/2017 12/27/2016 12/26/2016  Systolic BP 144 128 -  Diastolic BP 88 77 -  Wt. (Lbs) 195.5 - 195  BMI 31.55 - 31.47    Physical Exam  Constitutional: She is oriented to person, place, and time. She appears well-developed.  No distress.  Cardiovascular: Normal rate and regular rhythm.   Pulmonary/Chest: Effort normal and breath sounds normal.  Neurological: She is alert and oriented to person, place, and time.  Psychiatric:  Flat affect.  Vitals reviewed.   Lab Results  Component Value Date   WBC 5.3 12/27/2016   HGB 14.0 12/27/2016   HCT 41.3 12/27/2016   PLT 294 12/27/2016   GLUCOSE 97 12/27/2016   CHOL 218 (H) 03/31/2016   TRIG 184.0 (H) 03/31/2016   HDL 40.60 03/31/2016   LDLCALC 140 (H) 03/31/2016   ALT 18 12/26/2016   AST 22 12/26/2016   NA 140 12/27/2016   K 4.1 12/27/2016   CL 109 12/27/2016   CREATININE 0.74 12/27/2016   BUN 9 12/27/2016   CO2 26 12/27/2016   TSH 0.876 12/26/2016   HGBA1C 5.9 (H) 12/26/2016    Assessment & Plan:   Problem List Items Addressed This Visit    Migraine    Refilled imitrex.      Relevant Medications   metoprolol succinate (TOPROL-XL) 50 MG 24 hr tablet   SUMAtriptan (IMITREX) 50 MG tablet   Insomnia - Primary    Established problem, worsening. Stop ambien. Trial of lunesta.      Chronic low back pain   Relevant Orders   Ambulatory referral to Pain Clinic  Benign essential HTN    Worsening. Increasing metoprolol.       Relevant Medications   metoprolol succinate (TOPROL-XL) 50 MG 24 hr tablet      Meds ordered this encounter  Medications  . metoprolol succinate (TOPROL-XL) 50 MG 24 hr tablet    Sig: Take 1 tablet (50 mg total) by mouth daily.    Dispense:  90 tablet    Refill:  1  . SUMAtriptan (IMITREX) 50 MG tablet    Sig: 1 tablet once. May repeat in 2 hours if headache persists or recurs.    Dispense:  10 tablet    Refill:  6  . eszopiclone (LUNESTA) 1 MG TABS tablet    Sig: Take 1 tablet (1 mg total) by mouth at bedtime as needed for sleep. Take immediately before bedtime    Dispense:  90 tablet    Refill:  0     Follow-up: PRN  Everlene Other DO [facility]

## 2017-01-04 NOTE — Progress Notes (Signed)
Pre visit review using our clinic review tool, if applicable. No additional management support is needed unless otherwise documented below in the visit note. 

## 2017-01-12 ENCOUNTER — Ambulatory Visit: Payer: Medicare Other | Admitting: Family Medicine

## 2017-01-16 ENCOUNTER — Telehealth: Payer: Self-pay

## 2017-01-16 NOTE — Telephone Encounter (Signed)
PA approved 10/18/16-01/16/18

## 2017-01-16 NOTE — Telephone Encounter (Signed)
PA Lunesta completed on cover my meds.

## 2017-02-05 DIAGNOSIS — M545 Low back pain: Secondary | ICD-10-CM | POA: Diagnosis not present

## 2017-02-05 DIAGNOSIS — Z5181 Encounter for therapeutic drug level monitoring: Secondary | ICD-10-CM | POA: Diagnosis not present

## 2017-02-05 DIAGNOSIS — M5126 Other intervertebral disc displacement, lumbar region: Secondary | ICD-10-CM | POA: Diagnosis not present

## 2017-02-05 DIAGNOSIS — M47816 Spondylosis without myelopathy or radiculopathy, lumbar region: Secondary | ICD-10-CM | POA: Diagnosis not present

## 2017-02-05 DIAGNOSIS — M5416 Radiculopathy, lumbar region: Secondary | ICD-10-CM | POA: Diagnosis not present

## 2017-02-05 DIAGNOSIS — M5417 Radiculopathy, lumbosacral region: Secondary | ICD-10-CM | POA: Diagnosis not present

## 2017-03-07 ENCOUNTER — Telehealth: Payer: Self-pay | Admitting: *Deleted

## 2017-03-07 DIAGNOSIS — M545 Low back pain: Secondary | ICD-10-CM | POA: Diagnosis not present

## 2017-03-07 DIAGNOSIS — M5417 Radiculopathy, lumbosacral region: Secondary | ICD-10-CM | POA: Diagnosis not present

## 2017-03-07 DIAGNOSIS — M47816 Spondylosis without myelopathy or radiculopathy, lumbar region: Secondary | ICD-10-CM | POA: Diagnosis not present

## 2017-03-07 DIAGNOSIS — M5416 Radiculopathy, lumbar region: Secondary | ICD-10-CM | POA: Diagnosis not present

## 2017-03-07 DIAGNOSIS — M5126 Other intervertebral disc displacement, lumbar region: Secondary | ICD-10-CM | POA: Diagnosis not present

## 2017-03-07 NOTE — Telephone Encounter (Signed)
Patient [facility]  Pt contact [phone]

## 2017-03-13 DIAGNOSIS — G8929 Other chronic pain: Secondary | ICD-10-CM | POA: Diagnosis not present

## 2017-03-13 DIAGNOSIS — M79652 Pain in left thigh: Secondary | ICD-10-CM | POA: Diagnosis not present

## 2017-03-13 DIAGNOSIS — M545 Low back pain: Secondary | ICD-10-CM | POA: Diagnosis not present

## 2017-03-13 DIAGNOSIS — Z885 Allergy status to narcotic agent status: Secondary | ICD-10-CM | POA: Diagnosis not present

## 2017-03-20 DIAGNOSIS — F17210 Nicotine dependence, cigarettes, uncomplicated: Secondary | ICD-10-CM | POA: Diagnosis not present

## 2017-03-20 DIAGNOSIS — Z886 Allergy status to analgesic agent status: Secondary | ICD-10-CM | POA: Diagnosis not present

## 2017-03-20 DIAGNOSIS — E785 Hyperlipidemia, unspecified: Secondary | ICD-10-CM | POA: Diagnosis not present

## 2017-03-20 DIAGNOSIS — S99922A Unspecified injury of left foot, initial encounter: Secondary | ICD-10-CM | POA: Diagnosis not present

## 2017-03-20 DIAGNOSIS — Z79899 Other long term (current) drug therapy: Secondary | ICD-10-CM | POA: Diagnosis not present

## 2017-03-20 DIAGNOSIS — S9032XA Contusion of left foot, initial encounter: Secondary | ICD-10-CM | POA: Diagnosis not present

## 2017-03-20 DIAGNOSIS — G8911 Acute pain due to trauma: Secondary | ICD-10-CM | POA: Diagnosis not present

## 2017-03-20 DIAGNOSIS — Z885 Allergy status to narcotic agent status: Secondary | ICD-10-CM | POA: Diagnosis not present

## 2017-03-27 DIAGNOSIS — G894 Chronic pain syndrome: Secondary | ICD-10-CM | POA: Diagnosis not present

## 2017-03-27 DIAGNOSIS — M5136 Other intervertebral disc degeneration, lumbar region: Secondary | ICD-10-CM | POA: Diagnosis not present

## 2017-03-27 DIAGNOSIS — M47812 Spondylosis without myelopathy or radiculopathy, cervical region: Secondary | ICD-10-CM | POA: Diagnosis not present

## 2017-03-27 DIAGNOSIS — M791 Myalgia: Secondary | ICD-10-CM | POA: Diagnosis not present

## 2017-03-27 DIAGNOSIS — M25561 Pain in right knee: Secondary | ICD-10-CM | POA: Diagnosis not present

## 2017-03-27 DIAGNOSIS — M94262 Chondromalacia, left knee: Secondary | ICD-10-CM | POA: Diagnosis not present

## 2017-03-27 DIAGNOSIS — M25862 Other specified joint disorders, left knee: Secondary | ICD-10-CM | POA: Diagnosis not present

## 2017-03-27 DIAGNOSIS — Z79891 Long term (current) use of opiate analgesic: Secondary | ICD-10-CM | POA: Diagnosis not present

## 2017-03-27 DIAGNOSIS — M19012 Primary osteoarthritis, left shoulder: Secondary | ICD-10-CM | POA: Diagnosis not present

## 2017-03-27 DIAGNOSIS — Z9889 Other specified postprocedural states: Secondary | ICD-10-CM | POA: Diagnosis not present

## 2017-03-27 DIAGNOSIS — M47814 Spondylosis without myelopathy or radiculopathy, thoracic region: Secondary | ICD-10-CM | POA: Diagnosis not present

## 2017-04-19 DIAGNOSIS — Z79891 Long term (current) use of opiate analgesic: Secondary | ICD-10-CM | POA: Diagnosis not present

## 2017-04-19 DIAGNOSIS — M791 Myalgia: Secondary | ICD-10-CM | POA: Diagnosis not present

## 2017-04-19 DIAGNOSIS — M47812 Spondylosis without myelopathy or radiculopathy, cervical region: Secondary | ICD-10-CM | POA: Diagnosis not present

## 2017-04-19 DIAGNOSIS — G894 Chronic pain syndrome: Secondary | ICD-10-CM | POA: Diagnosis not present

## 2017-04-19 DIAGNOSIS — M19012 Primary osteoarthritis, left shoulder: Secondary | ICD-10-CM | POA: Diagnosis not present

## 2017-04-19 DIAGNOSIS — M47814 Spondylosis without myelopathy or radiculopathy, thoracic region: Secondary | ICD-10-CM | POA: Diagnosis not present

## 2017-04-19 DIAGNOSIS — M94262 Chondromalacia, left knee: Secondary | ICD-10-CM | POA: Diagnosis not present

## 2017-04-19 DIAGNOSIS — Z9889 Other specified postprocedural states: Secondary | ICD-10-CM | POA: Diagnosis not present

## 2017-04-19 DIAGNOSIS — M5136 Other intervertebral disc degeneration, lumbar region: Secondary | ICD-10-CM | POA: Diagnosis not present

## 2017-04-19 DIAGNOSIS — M25862 Other specified joint disorders, left knee: Secondary | ICD-10-CM | POA: Diagnosis not present

## 2017-04-19 DIAGNOSIS — M25561 Pain in right knee: Secondary | ICD-10-CM | POA: Diagnosis not present

## 2017-05-17 DIAGNOSIS — Z5321 Procedure and treatment not carried out due to patient leaving prior to being seen by health care provider: Secondary | ICD-10-CM | POA: Diagnosis not present

## 2017-05-17 DIAGNOSIS — R531 Weakness: Secondary | ICD-10-CM | POA: Diagnosis not present

## 2017-05-17 DIAGNOSIS — R51 Headache: Secondary | ICD-10-CM | POA: Diagnosis not present

## 2017-05-17 DIAGNOSIS — R079 Chest pain, unspecified: Secondary | ICD-10-CM | POA: Diagnosis not present

## 2017-05-17 DIAGNOSIS — R110 Nausea: Secondary | ICD-10-CM | POA: Diagnosis not present

## 2017-05-18 DIAGNOSIS — M19012 Primary osteoarthritis, left shoulder: Secondary | ICD-10-CM | POA: Diagnosis not present

## 2017-05-18 DIAGNOSIS — M25561 Pain in right knee: Secondary | ICD-10-CM | POA: Diagnosis not present

## 2017-05-18 DIAGNOSIS — Z79891 Long term (current) use of opiate analgesic: Secondary | ICD-10-CM | POA: Diagnosis not present

## 2017-05-18 DIAGNOSIS — M25862 Other specified joint disorders, left knee: Secondary | ICD-10-CM | POA: Diagnosis not present

## 2017-05-18 DIAGNOSIS — G894 Chronic pain syndrome: Secondary | ICD-10-CM | POA: Diagnosis not present

## 2017-05-18 DIAGNOSIS — M5136 Other intervertebral disc degeneration, lumbar region: Secondary | ICD-10-CM | POA: Diagnosis not present

## 2017-05-18 DIAGNOSIS — M94262 Chondromalacia, left knee: Secondary | ICD-10-CM | POA: Diagnosis not present

## 2017-05-18 DIAGNOSIS — M47814 Spondylosis without myelopathy or radiculopathy, thoracic region: Secondary | ICD-10-CM | POA: Diagnosis not present

## 2017-05-18 DIAGNOSIS — Z9889 Other specified postprocedural states: Secondary | ICD-10-CM | POA: Diagnosis not present

## 2017-05-18 DIAGNOSIS — M791 Myalgia: Secondary | ICD-10-CM | POA: Diagnosis not present

## 2017-05-18 DIAGNOSIS — M47812 Spondylosis without myelopathy or radiculopathy, cervical region: Secondary | ICD-10-CM | POA: Diagnosis not present

## 2017-05-23 DIAGNOSIS — M19012 Primary osteoarthritis, left shoulder: Secondary | ICD-10-CM | POA: Diagnosis not present

## 2017-05-23 DIAGNOSIS — M5136 Other intervertebral disc degeneration, lumbar region: Secondary | ICD-10-CM | POA: Diagnosis not present

## 2017-05-23 DIAGNOSIS — M94262 Chondromalacia, left knee: Secondary | ICD-10-CM | POA: Diagnosis not present

## 2017-05-23 DIAGNOSIS — M47814 Spondylosis without myelopathy or radiculopathy, thoracic region: Secondary | ICD-10-CM | POA: Diagnosis not present

## 2017-05-23 DIAGNOSIS — M25561 Pain in right knee: Secondary | ICD-10-CM | POA: Diagnosis not present

## 2017-05-23 DIAGNOSIS — Z9889 Other specified postprocedural states: Secondary | ICD-10-CM | POA: Diagnosis not present

## 2017-05-23 DIAGNOSIS — M47812 Spondylosis without myelopathy or radiculopathy, cervical region: Secondary | ICD-10-CM | POA: Diagnosis not present

## 2017-05-23 DIAGNOSIS — Z79891 Long term (current) use of opiate analgesic: Secondary | ICD-10-CM | POA: Diagnosis not present

## 2017-05-23 DIAGNOSIS — M791 Myalgia: Secondary | ICD-10-CM | POA: Diagnosis not present

## 2017-05-23 DIAGNOSIS — G894 Chronic pain syndrome: Secondary | ICD-10-CM | POA: Diagnosis not present

## 2017-05-23 DIAGNOSIS — M25862 Other specified joint disorders, left knee: Secondary | ICD-10-CM | POA: Diagnosis not present

## 2017-06-04 DIAGNOSIS — G8929 Other chronic pain: Secondary | ICD-10-CM | POA: Diagnosis not present

## 2017-06-04 DIAGNOSIS — F329 Major depressive disorder, single episode, unspecified: Secondary | ICD-10-CM | POA: Diagnosis not present

## 2017-06-04 DIAGNOSIS — F17210 Nicotine dependence, cigarettes, uncomplicated: Secondary | ICD-10-CM | POA: Diagnosis not present

## 2017-06-04 DIAGNOSIS — Z79891 Long term (current) use of opiate analgesic: Secondary | ICD-10-CM | POA: Diagnosis not present

## 2017-06-04 DIAGNOSIS — G44219 Episodic tension-type headache, not intractable: Secondary | ICD-10-CM | POA: Diagnosis not present

## 2017-06-04 DIAGNOSIS — M549 Dorsalgia, unspecified: Secondary | ICD-10-CM | POA: Diagnosis not present

## 2017-06-04 DIAGNOSIS — I10 Essential (primary) hypertension: Secondary | ICD-10-CM | POA: Diagnosis not present

## 2017-06-04 DIAGNOSIS — Z79899 Other long term (current) drug therapy: Secondary | ICD-10-CM | POA: Diagnosis not present

## 2017-06-04 DIAGNOSIS — K219 Gastro-esophageal reflux disease without esophagitis: Secondary | ICD-10-CM | POA: Diagnosis not present

## 2017-06-13 DIAGNOSIS — M5136 Other intervertebral disc degeneration, lumbar region: Secondary | ICD-10-CM | POA: Diagnosis not present

## 2017-06-13 DIAGNOSIS — M791 Myalgia: Secondary | ICD-10-CM | POA: Diagnosis not present

## 2017-06-13 DIAGNOSIS — M47814 Spondylosis without myelopathy or radiculopathy, thoracic region: Secondary | ICD-10-CM | POA: Diagnosis not present

## 2017-06-13 DIAGNOSIS — M25862 Other specified joint disorders, left knee: Secondary | ICD-10-CM | POA: Diagnosis not present

## 2017-06-13 DIAGNOSIS — Z9889 Other specified postprocedural states: Secondary | ICD-10-CM | POA: Diagnosis not present

## 2017-06-13 DIAGNOSIS — M19012 Primary osteoarthritis, left shoulder: Secondary | ICD-10-CM | POA: Diagnosis not present

## 2017-06-13 DIAGNOSIS — Z79891 Long term (current) use of opiate analgesic: Secondary | ICD-10-CM | POA: Diagnosis not present

## 2017-06-13 DIAGNOSIS — M94262 Chondromalacia, left knee: Secondary | ICD-10-CM | POA: Diagnosis not present

## 2017-06-13 DIAGNOSIS — M25561 Pain in right knee: Secondary | ICD-10-CM | POA: Diagnosis not present

## 2017-06-13 DIAGNOSIS — G894 Chronic pain syndrome: Secondary | ICD-10-CM | POA: Diagnosis not present

## 2017-06-13 DIAGNOSIS — M47812 Spondylosis without myelopathy or radiculopathy, cervical region: Secondary | ICD-10-CM | POA: Diagnosis not present

## 2017-06-22 ENCOUNTER — Other Ambulatory Visit: Payer: Self-pay | Admitting: Family Medicine

## 2017-07-02 IMAGING — MR MR SHOULDER*R* W/O CM
5 series · 40 of 40 positions shown · non-contrast
Comparison: Radiographs 10/29/2016

CLINICAL DATA: Right shoulder pain and limited range of motion for
3 weeks. No specific injury.

EXAM:
MRI OF THE RIGHT SHOULDER WITHOUT CONTRAST
TECHNIQUE: Multiplanar, multisequence MR imaging of the shoulder was performed.
No intravenous contrast was administered.

[Series 3: T2 fat-sat · axial · 4.0mm · 0.47mm/px · z∈[-32,+74]mm · 8 of 25 slices shown (1 of 3)]
[im 1/25]
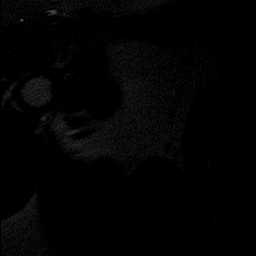
[im 4/25]
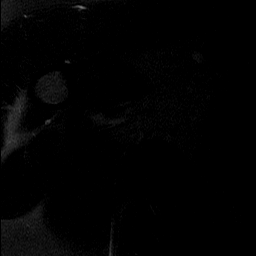
[im 7/25]
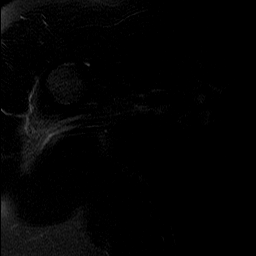
[im 11/25]
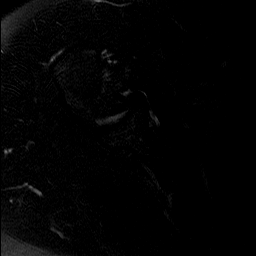
[im 14/25]
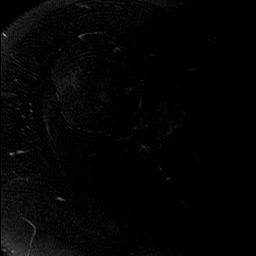
[im 18/25]
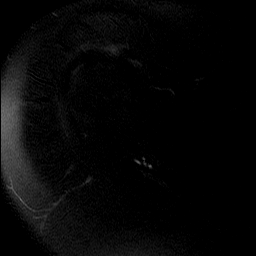
[im 21/25]
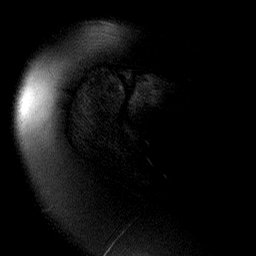
[im 25/25]
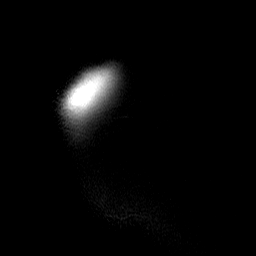

[Series 4: T2 fat-sat · oblique · 4.0mm · 0.62mm/px · 8 of 23 slices shown (2 of 3)]
[im 1/23]
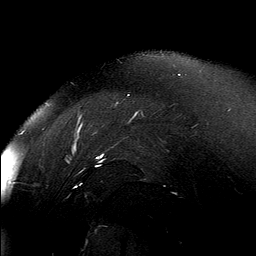
[im 4/23]
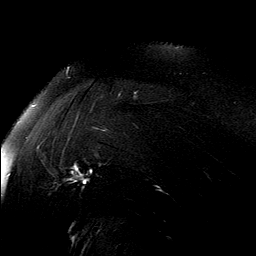
[im 7/23]
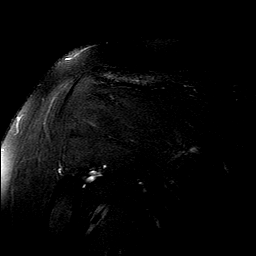
[im 10/23]
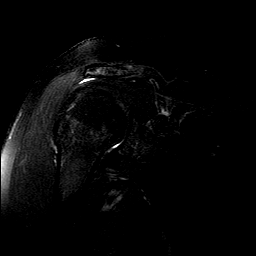
[im 13/23]
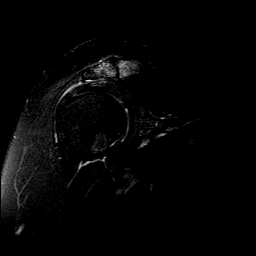
[im 16/23]
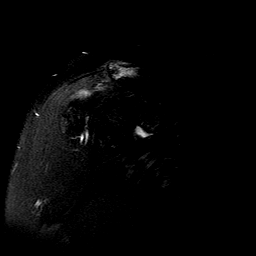
[im 19/23]
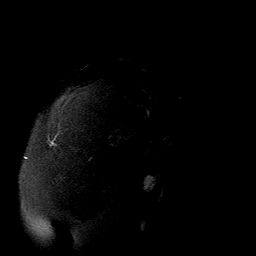
[im 23/23]
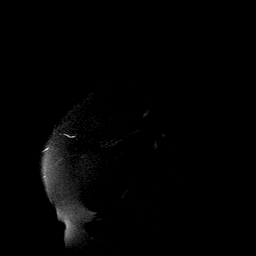

[Series 5: PD · oblique · 4.0mm · 0.62mm/px · 8 of 23 slices shown]
[im 1/23]
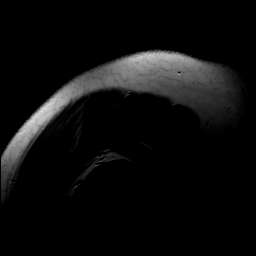
[im 4/23]
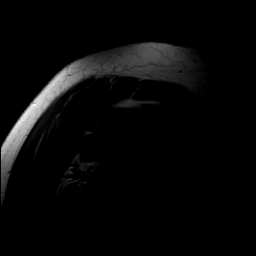
[im 7/23]
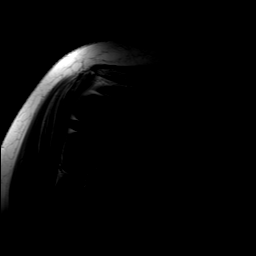
[im 10/23]
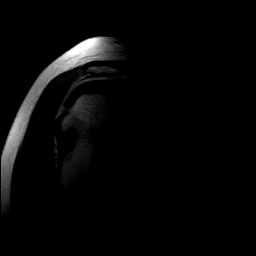
[im 13/23]
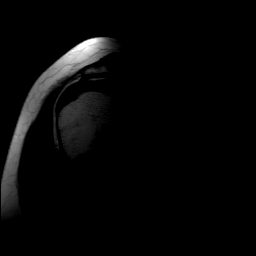
[im 16/23]
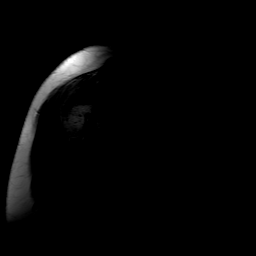
[im 19/23]
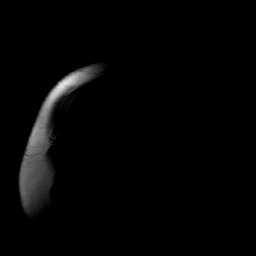
[im 23/23]
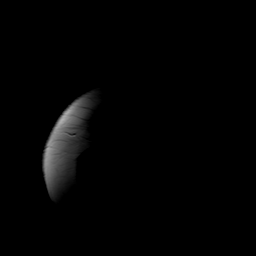

[Series 6: T1 · oblique · 4.0mm · 0.62mm/px · 8 of 23 slices shown]
[im 1/23]
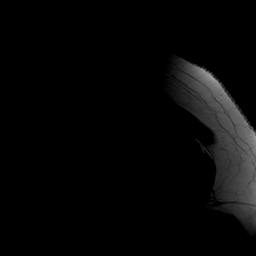
[im 4/23]
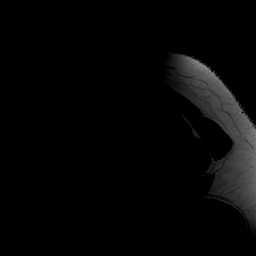
[im 7/23]
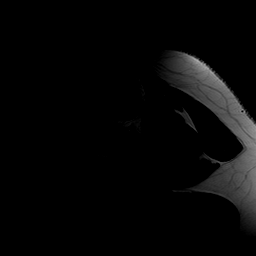
[im 10/23]
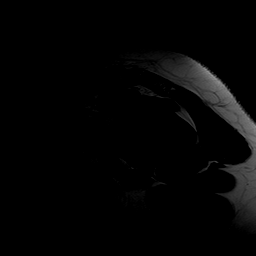
[im 13/23]
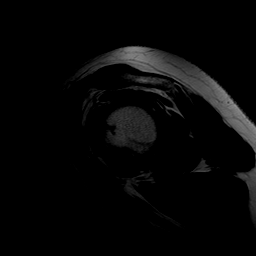
[im 16/23]
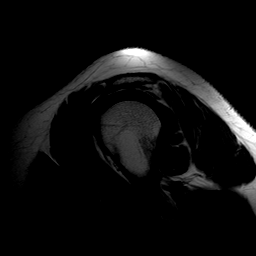
[im 19/23]
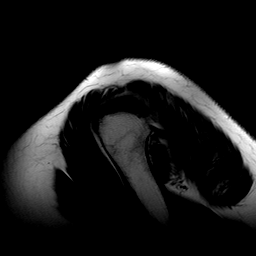
[im 23/23]
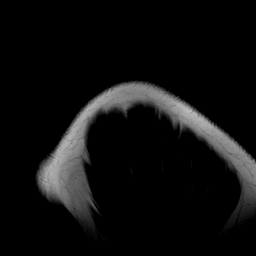

[Series 7: T2 fat-sat · oblique · 4.0mm · 0.62mm/px · 8 of 23 slices shown (3 of 3)]
[im 1/23]
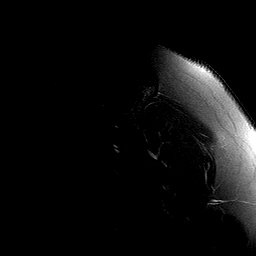
[im 4/23]
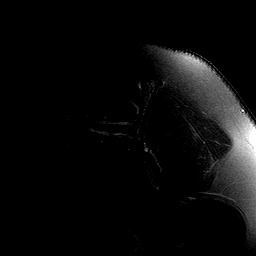
[im 7/23]
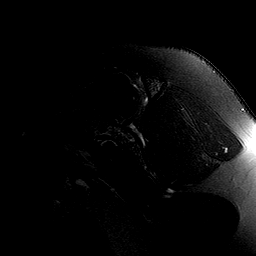
[im 10/23]
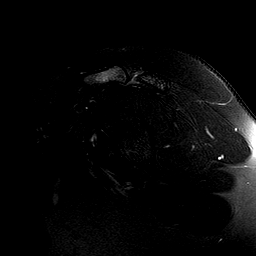
[im 13/23]
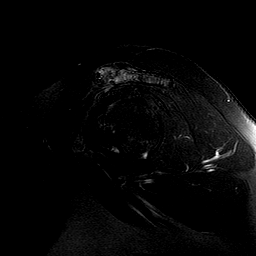
[im 16/23]
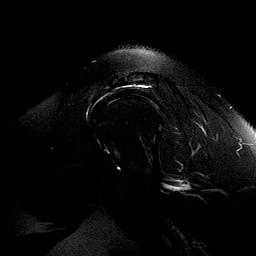
[im 19/23]
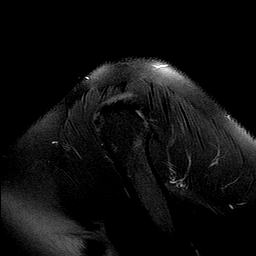
[im 23/23]
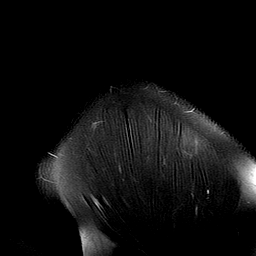

[40 of 40 positions shown; findings below may reference images not displayed]

FINDINGS: Rotator cuff: Mild rotator cuff tendinopathy/ tendinosis. Small
interstitial tears and bursal and articular surface fraying and
fibrillation. No full thickness retracted tear.

Muscles:  Normal

Biceps long head:  Intact

Acromioclavicular Joint: Mild AC joint degenerative changes. Fairly
significant edema like signal abnormality in the distal clavicle and
acromion suggesting an ongoing stress related process. No AC joint
separation or findings for septic arthritis. The acromion is type 2
in shape. No lateral downsloping or undersurface spurring change.

Glenohumeral Joint: No significant degenerative changes, joint
effusion or synovitis.

Labrum:  Intact.

Bones: Marrow edema involving the distal clavicle and acromion as
discussed above.

Other: Moderate subacromial/ subdeltoid bursitis.
IMPRESSION: 1. Mild rotator cuff tendinopathy/tendinosis. Small interstitial
tear is and areas of bursal and articular surface fraying and
fibrillation. No full-thickness tear.
2. Intact long head biceps tendon and glenoid labrum.
3. Mild AC joint degenerative changes and fairly significant edema
like signal abnormality in the distal clavicle and acromion
suggesting an ongoing stress related process without definite
fracture.
4. Moderate subacromial/subdeltoid bursitis.

## 2017-07-06 DIAGNOSIS — L600 Ingrowing nail: Secondary | ICD-10-CM | POA: Diagnosis not present

## 2017-07-06 DIAGNOSIS — M2022 Hallux rigidus, left foot: Secondary | ICD-10-CM | POA: Diagnosis not present

## 2017-07-06 DIAGNOSIS — M2021 Hallux rigidus, right foot: Secondary | ICD-10-CM | POA: Diagnosis not present

## 2017-07-11 DIAGNOSIS — Z9889 Other specified postprocedural states: Secondary | ICD-10-CM | POA: Diagnosis not present

## 2017-07-11 DIAGNOSIS — M25561 Pain in right knee: Secondary | ICD-10-CM | POA: Diagnosis not present

## 2017-07-11 DIAGNOSIS — M5136 Other intervertebral disc degeneration, lumbar region: Secondary | ICD-10-CM | POA: Diagnosis not present

## 2017-07-11 DIAGNOSIS — Z720 Tobacco use: Secondary | ICD-10-CM | POA: Diagnosis not present

## 2017-07-11 DIAGNOSIS — M47812 Spondylosis without myelopathy or radiculopathy, cervical region: Secondary | ICD-10-CM | POA: Diagnosis not present

## 2017-07-11 DIAGNOSIS — G894 Chronic pain syndrome: Secondary | ICD-10-CM | POA: Diagnosis not present

## 2017-07-11 DIAGNOSIS — Z79891 Long term (current) use of opiate analgesic: Secondary | ICD-10-CM | POA: Diagnosis not present

## 2017-07-11 DIAGNOSIS — M94262 Chondromalacia, left knee: Secondary | ICD-10-CM | POA: Diagnosis not present

## 2017-07-11 DIAGNOSIS — M25862 Other specified joint disorders, left knee: Secondary | ICD-10-CM | POA: Diagnosis not present

## 2017-07-11 DIAGNOSIS — M47814 Spondylosis without myelopathy or radiculopathy, thoracic region: Secondary | ICD-10-CM | POA: Diagnosis not present

## 2017-07-11 DIAGNOSIS — M19012 Primary osteoarthritis, left shoulder: Secondary | ICD-10-CM | POA: Diagnosis not present

## 2017-07-13 DIAGNOSIS — J449 Chronic obstructive pulmonary disease, unspecified: Secondary | ICD-10-CM | POA: Diagnosis not present

## 2017-07-13 DIAGNOSIS — I10 Essential (primary) hypertension: Secondary | ICD-10-CM | POA: Diagnosis not present

## 2017-07-13 DIAGNOSIS — Z23 Encounter for immunization: Secondary | ICD-10-CM | POA: Diagnosis not present

## 2017-07-13 DIAGNOSIS — E662 Morbid (severe) obesity with alveolar hypoventilation: Secondary | ICD-10-CM | POA: Diagnosis not present

## 2017-07-13 DIAGNOSIS — Z6839 Body mass index (BMI) 39.0-39.9, adult: Secondary | ICD-10-CM | POA: Diagnosis not present

## 2017-08-03 DIAGNOSIS — L600 Ingrowing nail: Secondary | ICD-10-CM | POA: Diagnosis not present

## 2017-08-08 DIAGNOSIS — G894 Chronic pain syndrome: Secondary | ICD-10-CM | POA: Diagnosis not present

## 2017-08-08 DIAGNOSIS — M47814 Spondylosis without myelopathy or radiculopathy, thoracic region: Secondary | ICD-10-CM | POA: Diagnosis not present

## 2017-08-08 DIAGNOSIS — M47812 Spondylosis without myelopathy or radiculopathy, cervical region: Secondary | ICD-10-CM | POA: Diagnosis not present

## 2017-08-08 DIAGNOSIS — M25561 Pain in right knee: Secondary | ICD-10-CM | POA: Diagnosis not present

## 2017-08-08 DIAGNOSIS — Z9889 Other specified postprocedural states: Secondary | ICD-10-CM | POA: Diagnosis not present

## 2017-08-08 DIAGNOSIS — M5136 Other intervertebral disc degeneration, lumbar region: Secondary | ICD-10-CM | POA: Diagnosis not present

## 2017-08-08 DIAGNOSIS — M25862 Other specified joint disorders, left knee: Secondary | ICD-10-CM | POA: Diagnosis not present

## 2017-08-08 DIAGNOSIS — M19012 Primary osteoarthritis, left shoulder: Secondary | ICD-10-CM | POA: Diagnosis not present

## 2017-08-08 DIAGNOSIS — Z720 Tobacco use: Secondary | ICD-10-CM | POA: Diagnosis not present

## 2017-08-08 DIAGNOSIS — M94262 Chondromalacia, left knee: Secondary | ICD-10-CM | POA: Diagnosis not present

## 2017-08-08 DIAGNOSIS — Z79891 Long term (current) use of opiate analgesic: Secondary | ICD-10-CM | POA: Diagnosis not present

## 2017-08-28 DIAGNOSIS — K219 Gastro-esophageal reflux disease without esophagitis: Secondary | ICD-10-CM | POA: Diagnosis not present

## 2017-08-28 DIAGNOSIS — R51 Headache: Secondary | ICD-10-CM | POA: Diagnosis not present

## 2017-08-28 DIAGNOSIS — I10 Essential (primary) hypertension: Secondary | ICD-10-CM | POA: Diagnosis not present

## 2017-08-28 DIAGNOSIS — G43519 Persistent migraine aura without cerebral infarction, intractable, without status migrainosus: Secondary | ICD-10-CM | POA: Diagnosis not present

## 2017-08-28 DIAGNOSIS — E559 Vitamin D deficiency, unspecified: Secondary | ICD-10-CM | POA: Diagnosis not present

## 2017-09-04 DIAGNOSIS — L600 Ingrowing nail: Secondary | ICD-10-CM | POA: Diagnosis not present

## 2017-09-05 DIAGNOSIS — M94262 Chondromalacia, left knee: Secondary | ICD-10-CM | POA: Diagnosis not present

## 2017-09-05 DIAGNOSIS — M25561 Pain in right knee: Secondary | ICD-10-CM | POA: Diagnosis not present

## 2017-09-05 DIAGNOSIS — G894 Chronic pain syndrome: Secondary | ICD-10-CM | POA: Diagnosis not present

## 2017-09-05 DIAGNOSIS — Z720 Tobacco use: Secondary | ICD-10-CM | POA: Diagnosis not present

## 2017-09-05 DIAGNOSIS — Z9889 Other specified postprocedural states: Secondary | ICD-10-CM | POA: Diagnosis not present

## 2017-09-05 DIAGNOSIS — M25862 Other specified joint disorders, left knee: Secondary | ICD-10-CM | POA: Diagnosis not present

## 2017-09-05 DIAGNOSIS — M47812 Spondylosis without myelopathy or radiculopathy, cervical region: Secondary | ICD-10-CM | POA: Diagnosis not present

## 2017-09-05 DIAGNOSIS — M47814 Spondylosis without myelopathy or radiculopathy, thoracic region: Secondary | ICD-10-CM | POA: Diagnosis not present

## 2017-09-05 DIAGNOSIS — M5136 Other intervertebral disc degeneration, lumbar region: Secondary | ICD-10-CM | POA: Diagnosis not present

## 2017-09-05 DIAGNOSIS — M19012 Primary osteoarthritis, left shoulder: Secondary | ICD-10-CM | POA: Diagnosis not present

## 2017-09-05 DIAGNOSIS — Z79891 Long term (current) use of opiate analgesic: Secondary | ICD-10-CM | POA: Diagnosis not present

## 2017-09-12 DIAGNOSIS — M47814 Spondylosis without myelopathy or radiculopathy, thoracic region: Secondary | ICD-10-CM | POA: Diagnosis not present

## 2017-09-12 DIAGNOSIS — M19011 Primary osteoarthritis, right shoulder: Secondary | ICD-10-CM | POA: Diagnosis not present

## 2017-09-12 DIAGNOSIS — M47812 Spondylosis without myelopathy or radiculopathy, cervical region: Secondary | ICD-10-CM | POA: Diagnosis not present

## 2017-09-12 DIAGNOSIS — M19012 Primary osteoarthritis, left shoulder: Secondary | ICD-10-CM | POA: Diagnosis not present

## 2017-09-12 DIAGNOSIS — I10 Essential (primary) hypertension: Secondary | ICD-10-CM | POA: Diagnosis not present

## 2017-09-12 DIAGNOSIS — R7989 Other specified abnormal findings of blood chemistry: Secondary | ICD-10-CM | POA: Diagnosis not present

## 2017-09-12 DIAGNOSIS — N951 Menopausal and female climacteric states: Secondary | ICD-10-CM | POA: Diagnosis not present

## 2017-09-12 DIAGNOSIS — Z90722 Acquired absence of ovaries, bilateral: Secondary | ICD-10-CM | POA: Diagnosis not present

## 2017-09-12 DIAGNOSIS — G8929 Other chronic pain: Secondary | ICD-10-CM | POA: Diagnosis not present

## 2017-10-05 DIAGNOSIS — M25862 Other specified joint disorders, left knee: Secondary | ICD-10-CM | POA: Diagnosis not present

## 2017-10-05 DIAGNOSIS — M19012 Primary osteoarthritis, left shoulder: Secondary | ICD-10-CM | POA: Diagnosis not present

## 2017-10-05 DIAGNOSIS — M47812 Spondylosis without myelopathy or radiculopathy, cervical region: Secondary | ICD-10-CM | POA: Diagnosis not present

## 2017-10-05 DIAGNOSIS — M5136 Other intervertebral disc degeneration, lumbar region: Secondary | ICD-10-CM | POA: Diagnosis not present

## 2017-10-05 DIAGNOSIS — Z9889 Other specified postprocedural states: Secondary | ICD-10-CM | POA: Diagnosis not present

## 2017-10-05 DIAGNOSIS — Z720 Tobacco use: Secondary | ICD-10-CM | POA: Diagnosis not present

## 2017-10-05 DIAGNOSIS — Z79891 Long term (current) use of opiate analgesic: Secondary | ICD-10-CM | POA: Diagnosis not present

## 2017-10-05 DIAGNOSIS — M25561 Pain in right knee: Secondary | ICD-10-CM | POA: Diagnosis not present

## 2017-10-05 DIAGNOSIS — G894 Chronic pain syndrome: Secondary | ICD-10-CM | POA: Diagnosis not present

## 2017-10-05 DIAGNOSIS — M94262 Chondromalacia, left knee: Secondary | ICD-10-CM | POA: Diagnosis not present

## 2017-10-05 DIAGNOSIS — M47814 Spondylosis without myelopathy or radiculopathy, thoracic region: Secondary | ICD-10-CM | POA: Diagnosis not present

## 2017-10-29 DIAGNOSIS — H5462 Unqualified visual loss, left eye, normal vision right eye: Secondary | ICD-10-CM | POA: Diagnosis not present

## 2017-10-29 DIAGNOSIS — I10 Essential (primary) hypertension: Secondary | ICD-10-CM | POA: Diagnosis not present

## 2017-10-29 DIAGNOSIS — F17210 Nicotine dependence, cigarettes, uncomplicated: Secondary | ICD-10-CM | POA: Diagnosis not present

## 2017-10-29 DIAGNOSIS — Z79891 Long term (current) use of opiate analgesic: Secondary | ICD-10-CM | POA: Diagnosis not present

## 2017-10-29 DIAGNOSIS — M549 Dorsalgia, unspecified: Secondary | ICD-10-CM | POA: Diagnosis not present

## 2017-10-29 DIAGNOSIS — K219 Gastro-esophageal reflux disease without esophagitis: Secondary | ICD-10-CM | POA: Diagnosis not present

## 2017-10-29 DIAGNOSIS — G8929 Other chronic pain: Secondary | ICD-10-CM | POA: Diagnosis not present

## 2017-10-29 DIAGNOSIS — H538 Other visual disturbances: Secondary | ICD-10-CM | POA: Diagnosis not present

## 2017-10-29 DIAGNOSIS — Z79899 Other long term (current) drug therapy: Secondary | ICD-10-CM | POA: Diagnosis not present

## 2017-11-05 DIAGNOSIS — M19012 Primary osteoarthritis, left shoulder: Secondary | ICD-10-CM | POA: Diagnosis not present

## 2017-11-05 DIAGNOSIS — M25862 Other specified joint disorders, left knee: Secondary | ICD-10-CM | POA: Diagnosis not present

## 2017-11-05 DIAGNOSIS — M25561 Pain in right knee: Secondary | ICD-10-CM | POA: Diagnosis not present

## 2017-11-05 DIAGNOSIS — M5136 Other intervertebral disc degeneration, lumbar region: Secondary | ICD-10-CM | POA: Diagnosis not present

## 2017-11-05 DIAGNOSIS — M47814 Spondylosis without myelopathy or radiculopathy, thoracic region: Secondary | ICD-10-CM | POA: Diagnosis not present

## 2017-11-05 DIAGNOSIS — Z79891 Long term (current) use of opiate analgesic: Secondary | ICD-10-CM | POA: Diagnosis not present

## 2017-11-05 DIAGNOSIS — G894 Chronic pain syndrome: Secondary | ICD-10-CM | POA: Diagnosis not present

## 2017-11-05 DIAGNOSIS — M47812 Spondylosis without myelopathy or radiculopathy, cervical region: Secondary | ICD-10-CM | POA: Diagnosis not present

## 2017-11-05 DIAGNOSIS — Z9889 Other specified postprocedural states: Secondary | ICD-10-CM | POA: Diagnosis not present

## 2017-11-05 DIAGNOSIS — M94262 Chondromalacia, left knee: Secondary | ICD-10-CM | POA: Diagnosis not present

## 2017-11-05 DIAGNOSIS — Z720 Tobacco use: Secondary | ICD-10-CM | POA: Diagnosis not present

## 2017-11-06 DIAGNOSIS — G894 Chronic pain syndrome: Secondary | ICD-10-CM | POA: Diagnosis not present

## 2017-11-06 DIAGNOSIS — N951 Menopausal and female climacteric states: Secondary | ICD-10-CM | POA: Diagnosis not present

## 2017-11-06 DIAGNOSIS — M5030 Other cervical disc degeneration, unspecified cervical region: Secondary | ICD-10-CM | POA: Diagnosis not present

## 2017-12-03 DIAGNOSIS — Z720 Tobacco use: Secondary | ICD-10-CM | POA: Diagnosis not present

## 2017-12-03 DIAGNOSIS — Z9889 Other specified postprocedural states: Secondary | ICD-10-CM | POA: Diagnosis not present

## 2017-12-03 DIAGNOSIS — M94262 Chondromalacia, left knee: Secondary | ICD-10-CM | POA: Diagnosis not present

## 2017-12-03 DIAGNOSIS — M5136 Other intervertebral disc degeneration, lumbar region: Secondary | ICD-10-CM | POA: Diagnosis not present

## 2017-12-03 DIAGNOSIS — M25561 Pain in right knee: Secondary | ICD-10-CM | POA: Diagnosis not present

## 2017-12-03 DIAGNOSIS — M47812 Spondylosis without myelopathy or radiculopathy, cervical region: Secondary | ICD-10-CM | POA: Diagnosis not present

## 2017-12-03 DIAGNOSIS — Z79891 Long term (current) use of opiate analgesic: Secondary | ICD-10-CM | POA: Diagnosis not present

## 2017-12-03 DIAGNOSIS — G894 Chronic pain syndrome: Secondary | ICD-10-CM | POA: Diagnosis not present

## 2017-12-03 DIAGNOSIS — M25862 Other specified joint disorders, left knee: Secondary | ICD-10-CM | POA: Diagnosis not present

## 2017-12-03 DIAGNOSIS — M19012 Primary osteoarthritis, left shoulder: Secondary | ICD-10-CM | POA: Diagnosis not present

## 2017-12-03 DIAGNOSIS — M47814 Spondylosis without myelopathy or radiculopathy, thoracic region: Secondary | ICD-10-CM | POA: Diagnosis not present

## 2017-12-12 DIAGNOSIS — I10 Essential (primary) hypertension: Secondary | ICD-10-CM | POA: Diagnosis not present

## 2017-12-12 DIAGNOSIS — M150 Primary generalized (osteo)arthritis: Secondary | ICD-10-CM | POA: Diagnosis not present

## 2017-12-12 DIAGNOSIS — R52 Pain, unspecified: Secondary | ICD-10-CM | POA: Diagnosis not present

## 2017-12-12 DIAGNOSIS — E785 Hyperlipidemia, unspecified: Secondary | ICD-10-CM | POA: Diagnosis not present

## 2017-12-12 DIAGNOSIS — E782 Mixed hyperlipidemia: Secondary | ICD-10-CM | POA: Diagnosis not present

## 2017-12-19 DIAGNOSIS — I10 Essential (primary) hypertension: Secondary | ICD-10-CM | POA: Diagnosis not present

## 2017-12-19 DIAGNOSIS — M1711 Unilateral primary osteoarthritis, right knee: Secondary | ICD-10-CM | POA: Diagnosis not present

## 2017-12-19 DIAGNOSIS — Z9889 Other specified postprocedural states: Secondary | ICD-10-CM | POA: Diagnosis not present

## 2018-01-03 DIAGNOSIS — M1710 Unilateral primary osteoarthritis, unspecified knee: Secondary | ICD-10-CM | POA: Diagnosis not present

## 2018-01-03 DIAGNOSIS — M47814 Spondylosis without myelopathy or radiculopathy, thoracic region: Secondary | ICD-10-CM | POA: Diagnosis not present

## 2018-01-03 DIAGNOSIS — M5136 Other intervertebral disc degeneration, lumbar region: Secondary | ICD-10-CM | POA: Diagnosis not present

## 2018-01-03 DIAGNOSIS — M47812 Spondylosis without myelopathy or radiculopathy, cervical region: Secondary | ICD-10-CM | POA: Diagnosis not present

## 2018-01-03 DIAGNOSIS — M25862 Other specified joint disorders, left knee: Secondary | ICD-10-CM | POA: Diagnosis not present

## 2018-01-03 DIAGNOSIS — Z720 Tobacco use: Secondary | ICD-10-CM | POA: Diagnosis not present

## 2018-01-03 DIAGNOSIS — Z9889 Other specified postprocedural states: Secondary | ICD-10-CM | POA: Diagnosis not present

## 2018-01-03 DIAGNOSIS — M19012 Primary osteoarthritis, left shoulder: Secondary | ICD-10-CM | POA: Diagnosis not present

## 2018-01-03 DIAGNOSIS — G894 Chronic pain syndrome: Secondary | ICD-10-CM | POA: Diagnosis not present

## 2018-01-03 DIAGNOSIS — M94262 Chondromalacia, left knee: Secondary | ICD-10-CM | POA: Diagnosis not present

## 2018-01-28 DIAGNOSIS — M25561 Pain in right knee: Secondary | ICD-10-CM | POA: Diagnosis not present

## 2018-01-28 DIAGNOSIS — Z6830 Body mass index (BMI) 30.0-30.9, adult: Secondary | ICD-10-CM | POA: Diagnosis not present

## 2018-01-28 DIAGNOSIS — Z4889 Encounter for other specified surgical aftercare: Secondary | ICD-10-CM | POA: Diagnosis not present

## 2018-01-31 DIAGNOSIS — M94262 Chondromalacia, left knee: Secondary | ICD-10-CM | POA: Diagnosis not present

## 2018-01-31 DIAGNOSIS — G894 Chronic pain syndrome: Secondary | ICD-10-CM | POA: Diagnosis not present

## 2018-01-31 DIAGNOSIS — M19012 Primary osteoarthritis, left shoulder: Secondary | ICD-10-CM | POA: Diagnosis not present

## 2018-01-31 DIAGNOSIS — M47814 Spondylosis without myelopathy or radiculopathy, thoracic region: Secondary | ICD-10-CM | POA: Diagnosis not present

## 2018-01-31 DIAGNOSIS — M1710 Unilateral primary osteoarthritis, unspecified knee: Secondary | ICD-10-CM | POA: Diagnosis not present

## 2018-01-31 DIAGNOSIS — M25862 Other specified joint disorders, left knee: Secondary | ICD-10-CM | POA: Diagnosis not present

## 2018-01-31 DIAGNOSIS — Z9889 Other specified postprocedural states: Secondary | ICD-10-CM | POA: Diagnosis not present

## 2018-01-31 DIAGNOSIS — M5136 Other intervertebral disc degeneration, lumbar region: Secondary | ICD-10-CM | POA: Diagnosis not present

## 2018-01-31 DIAGNOSIS — M47812 Spondylosis without myelopathy or radiculopathy, cervical region: Secondary | ICD-10-CM | POA: Diagnosis not present

## 2018-01-31 DIAGNOSIS — Z720 Tobacco use: Secondary | ICD-10-CM | POA: Diagnosis not present

## 2018-02-01 DIAGNOSIS — M2241 Chondromalacia patellae, right knee: Secondary | ICD-10-CM | POA: Diagnosis not present

## 2018-02-14 DIAGNOSIS — M5126 Other intervertebral disc displacement, lumbar region: Secondary | ICD-10-CM | POA: Diagnosis not present

## 2018-02-14 DIAGNOSIS — G8929 Other chronic pain: Secondary | ICD-10-CM | POA: Diagnosis not present

## 2018-02-14 DIAGNOSIS — M5127 Other intervertebral disc displacement, lumbosacral region: Secondary | ICD-10-CM | POA: Diagnosis not present

## 2022-12-01 ENCOUNTER — Other Ambulatory Visit: Payer: Self-pay | Admitting: Family Medicine

## 2022-12-01 DIAGNOSIS — Z1231 Encounter for screening mammogram for malignant neoplasm of breast: Secondary | ICD-10-CM
